# Patient Record
Sex: Female | Born: 1985 | Race: White | Hispanic: No | Marital: Married | Smoking: Never smoker
Health system: Southern US, Community
[De-identification: ages and names within clinical notes are randomized; demographics above are authoritative.]

## PROBLEM LIST (undated history)

## (undated) DIAGNOSIS — Z8711 Personal history of peptic ulcer disease: Secondary | ICD-10-CM

## (undated) DIAGNOSIS — F32A Depression, unspecified: Secondary | ICD-10-CM

## (undated) DIAGNOSIS — D649 Anemia, unspecified: Secondary | ICD-10-CM

## (undated) DIAGNOSIS — G43909 Migraine, unspecified, not intractable, without status migrainosus: Secondary | ICD-10-CM

## (undated) DIAGNOSIS — R519 Headache, unspecified: Secondary | ICD-10-CM

## (undated) DIAGNOSIS — Z8719 Personal history of other diseases of the digestive system: Secondary | ICD-10-CM

## (undated) DIAGNOSIS — F509 Eating disorder, unspecified: Secondary | ICD-10-CM

## (undated) DIAGNOSIS — F419 Anxiety disorder, unspecified: Secondary | ICD-10-CM

## (undated) DIAGNOSIS — N390 Urinary tract infection, site not specified: Secondary | ICD-10-CM

## (undated) DIAGNOSIS — R51 Headache: Secondary | ICD-10-CM

## (undated) HISTORY — DX: Personal history of other diseases of the digestive system: Z87.19

## (undated) HISTORY — DX: Anemia, unspecified: D64.9

## (undated) HISTORY — DX: Anxiety disorder, unspecified: F41.9

## (undated) HISTORY — PX: HERNIA REPAIR: SHX51

## (undated) HISTORY — DX: Migraine, unspecified, not intractable, without status migrainosus: G43.909

## (undated) HISTORY — DX: Headache: R51

## (undated) HISTORY — DX: Urinary tract infection, site not specified: N39.0

## (undated) HISTORY — DX: Depression, unspecified: F32.A

## (undated) HISTORY — DX: Eating disorder, unspecified: F50.9

## (undated) HISTORY — DX: Headache, unspecified: R51.9

## (undated) HISTORY — DX: Personal history of peptic ulcer disease: Z87.11

---

## 2012-03-01 HISTORY — PX: HERNIA REPAIR: SHX51

## 2012-12-12 ENCOUNTER — Other Ambulatory Visit (HOSPITAL_COMMUNITY)
Admission: RE | Admit: 2012-12-12 | Discharge: 2012-12-12 | Disposition: A | Discharge status: Home / Self Care | Payer: BC Managed Care – PPO | Source: Ambulatory Visit | Attending: Obstetrics & Gynecology | Admitting: Obstetrics & Gynecology

## 2012-12-12 DIAGNOSIS — N760 Acute vaginitis: Secondary | ICD-10-CM | POA: Insufficient documentation

## 2012-12-12 DIAGNOSIS — Z124 Encounter for screening for malignant neoplasm of cervix: Secondary | ICD-10-CM | POA: Insufficient documentation

## 2014-08-26 ENCOUNTER — Emergency Department
Admission: EM | Admit: 2014-08-26 | Discharge: 2014-08-26 | Disposition: A | Discharge status: Home / Self Care | Payer: BC Managed Care – PPO | Attending: Emergency Medicine | Admitting: Emergency Medicine

## 2014-08-26 DIAGNOSIS — K625 Hemorrhage of anus and rectum: Secondary | ICD-10-CM | POA: Diagnosis present

## 2014-08-26 DIAGNOSIS — Z3202 Encounter for pregnancy test, result negative: Secondary | ICD-10-CM | POA: Diagnosis not present

## 2014-08-26 LAB — URINALYSIS COMPLETE WITH MICROSCOPIC (ARMC ONLY)
BILIRUBIN URINE: NEGATIVE
Glucose, UA: NEGATIVE mg/dL
Leukocytes, UA: NEGATIVE
Nitrite: NEGATIVE
Protein, ur: NEGATIVE mg/dL
Specific Gravity, Urine: 1.016 (ref 1.005–1.030)
pH: 5 (ref 5.0–8.0)

## 2014-08-26 LAB — CBC WITH DIFFERENTIAL/PLATELET
BASOS ABS: 0.1 10*3/uL (ref 0–0.1)
Basophils Relative: 2 %
Eosinophils Absolute: 0.3 10*3/uL (ref 0–0.7)
Eosinophils Relative: 6 %
HCT: 40.8 % (ref 35.0–47.0)
Hemoglobin: 13.5 g/dL (ref 12.0–16.0)
LYMPHS PCT: 17 %
Lymphs Abs: 0.9 10*3/uL — ABNORMAL LOW (ref 1.0–3.6)
MCH: 30 pg (ref 26.0–34.0)
MCHC: 33 g/dL (ref 32.0–36.0)
MCV: 90.9 fL (ref 80.0–100.0)
Monocytes Absolute: 0.6 10*3/uL (ref 0.2–0.9)
Monocytes Relative: 11 %
NEUTROS ABS: 3.6 10*3/uL (ref 1.4–6.5)
Neutrophils Relative %: 64 %
PLATELETS: 121 10*3/uL — AB (ref 150–440)
RBC: 4.49 MIL/uL (ref 3.80–5.20)
RDW: 13.1 % (ref 11.5–14.5)
WBC: 5.6 10*3/uL (ref 3.6–11.0)

## 2014-08-26 LAB — COMPREHENSIVE METABOLIC PANEL
ALBUMIN: 4.2 g/dL (ref 3.5–5.0)
ALK PHOS: 35 U/L — AB (ref 38–126)
ALT: 26 U/L (ref 14–54)
ANION GAP: 8 (ref 5–15)
AST: 22 U/L (ref 15–41)
BUN: 8 mg/dL (ref 6–20)
CALCIUM: 8.6 mg/dL — AB (ref 8.9–10.3)
CO2: 26 mmol/L (ref 22–32)
Chloride: 106 mmol/L (ref 101–111)
Creatinine, Ser: 0.59 mg/dL (ref 0.44–1.00)
Glucose, Bld: 88 mg/dL (ref 65–99)
POTASSIUM: 4.1 mmol/L (ref 3.5–5.1)
SODIUM: 140 mmol/L (ref 135–145)
TOTAL PROTEIN: 6.8 g/dL (ref 6.5–8.1)
Total Bilirubin: 0.5 mg/dL (ref 0.3–1.2)

## 2014-08-26 NOTE — ED Provider Notes (Addendum)
[facility] Emergency Department Provider Note     Time seen: ----------------------------------------- 2:58 PM on 08/26/2014 -----------------------------------------    I have reviewed the triage vital signs and the nursing notes.   HISTORY  Chief Complaint Rectal Bleeding    HPI Virginia Robinson is a 29 y.o. female who presents ER after she had a larger bowel movement on Saturday with some bleeding. Patient states Saturday and Sunday some blood was noted, denies any today but has not had BM today. [facility] and they did a rectal exam and rectal exam was negative for blood with Hemoccult testing. Patient denies any complaint now, just was sent here by the urgent care. One of her concerns was the shaking chill she had associated with one the episodes of rectal bleeding. She is not had significant rectal bleeding before. States she had a slight bit of pain associated bowel movement in a slight bit of blood about a month ago.   No past medical history on file.  There are no active problems to display for this patient.   No past surgical history on file.  Allergies Sulfa antibiotics  Social History History  Substance Use Topics  . Smoking status: Not on file  . Smokeless tobacco: Not on file  . Alcohol Use: Not on file    Review of Systems Constitutional: Negative for fever. Positive for chills Eyes: Negative for visual changes. ENT: Negative for sore throat. Cardiovascular: Negative for chest pain. Respiratory: Negative for shortness of breath. Gastrointestinal: Negative for abdominal pain, vomiting and diarrhea. Positive for rectal bleeding Genitourinary: Negative for dysuria. Musculoskeletal: Negative for back pain. Skin: Negative for rash. Neurological: Negative for headaches, focal weakness or numbness.  10-point ROS otherwise negative.  ____________________________________________   PHYSICAL  EXAM:  VITAL SIGNS: ED Triage Vitals  Enc Vitals Group     BP 08/26/14 1419 106/65 mmHg     Pulse Rate 08/26/14 1419 67     Resp 08/26/14 1419 16     Temp 08/26/14 1419 97.8 F (36.6 C)     Temp Source 08/26/14 1419 Oral     SpO2 08/26/14 1419 100 %     Weight 08/26/14 1419 130 lb (58.968 kg)     Height 08/26/14 1419 5\' 7"  (1.702 m)     Head Cir --      Peak Flow --      Pain Score --      Pain Loc --      Pain Edu? --      Excl. in [place]? --     Constitutional: Alert and oriented. Well appearing and in no distress. Eyes: Conjunctivae are normal. PERRL. Normal extraocular movements. ENT   Head: Normocephalic and atraumatic.   Nose: No congestion/rhinnorhea.   Mouth/Throat: Mucous membranes are moist.   Neck: No stridor. Hematological/Lymphatic/Immunilogical: No cervical lymphadenopathy. Cardiovascular: Normal rate, regular rhythm. Normal and symmetric distal pulses are present in all extremities. No murmurs, rubs, or gallops. Respiratory: Normal respiratory effort without tachypnea nor retractions. Breath sounds are clear and equal bilaterally. No wheezes/rales/rhonchi. Gastrointestinal: Soft and nontender. No distention. No abdominal bruits. There is no CVA tenderness. Rectal exam was deferred as it was just performed at urgent care Musculoskeletal: Nontender with normal range of motion in all extremities. No joint effusions.  No lower extremity tenderness nor edema. Neurologic:  Normal speech and language. No gross focal neurologic deficits are appreciated. Speech is normal. No gait instability. Skin:  Skin is  warm, dry and intact. No rash noted. Psychiatric: Mood and affect are normal. Speech and behavior are normal. Patient exhibits appropriate insight and judgment. ____________________________________________  ED COURSE:  Pertinent labs & imaging results that were available during my care of the patient were reviewed by me and considered in my medical decision  making (see chart for details). We'll check basic labs, reevaluate. The only concerning problems to raise the chills that she had. ____________________________________________    LABS (pertinent positives/negatives)  Labs Reviewed  CBC WITH DIFFERENTIAL/PLATELET - Abnormal; Notable for the following:    Platelets 121 (*)    Lymphs Abs 0.9 (*)    All other components within normal limits  COMPREHENSIVE METABOLIC PANEL - Abnormal; Notable for the following:    Calcium 8.6 (*)    Alkaline Phosphatase 35 (*)    All other components within normal limits  URINALYSIS COMPLETEWITH MICROSCOPIC ([facility] ONLY) - Abnormal; Notable for the following:    Color, Urine YELLOW (*)    APPearance CLEAR (*)    Ketones, ur 1+ (*)    Hgb urine dipstick 2+ (*)    Bacteria, UA RARE (*)    Squamous Epithelial / LPF 0-5 (*)    All other components within normal limits    RADIOLOGY None  ____________________________________________  FINAL ASSESSMENT AND PLAN  Rectal bleeding  Plan: Patient looks well now, labs are performed as dictated above. No indication for intervention at this time. Stools were heme-negative, labs are grossly unremarkable, will encourage her having her platelet count rechecked. We'll refer her to GI for follow-up with the symptoms persist.   Earleen Newport, MD   Earleen Newport, MD 08/26/14 [place], MD 08/26/14 [phone]

## 2014-08-26 NOTE — ED Notes (Addendum)
Pt states she had a larger bowel movement on Saturday and had some bleeding with BM Saturday and Sunday..,denies any today but has not had a BM today..states [facility] and they did a rectal exam and had neg. hemacult

## 2014-08-26 NOTE — Discharge Instructions (Signed)
Rectal Bleeding °Rectal bleeding is when blood passes out of the anus. It is usually a sign that something is wrong. It may not be serious, but it should always be evaluated. Rectal bleeding may present as bright red blood or extremely dark stools. The color may range from dark red or maroon to black (like tar). It is important that the cause of rectal bleeding be identified so treatment can be started and the problem corrected. °CAUSES  °· Hemorrhoids. These are enlarged (dilated) blood vessels or veins in the anal or rectal area. °· Fistulas. These are abnormal, burrowing channels that usually run from inside the rectum to the skin around the anus. They can bleed. °· Anal fissures. This is a tear in the tissue of the anus. Bleeding occurs with bowel movements. °· Diverticulosis. This is a condition in which pockets or sacs project from the bowel wall. Occasionally, the sacs can bleed. °· Diverticulitis. This is an infection involving diverticulosis of the colon. °· Proctitis and colitis. These are conditions in which the rectum, colon, or both, can become inflamed and pitted (ulcerated). °· Polyps and cancer. Polyps are non-cancerous (benign) growths in the colon that may bleed. Certain types of polyps turn into cancer. °· Protrusion of the rectum. Part of the rectum can project from the anus and bleed. °· Certain medicines. °· Intestinal infections. °· Blood vessel abnormalities. °HOME CARE INSTRUCTIONS °· Eat a high-fiber diet to keep your stool soft. °· Limit activity. °· Drink enough fluids to keep your urine clear or pale yellow. °· Warm baths may be useful to soothe rectal pain. °· Follow up with your caregiver as directed. °SEEK IMMEDIATE MEDICAL CARE IF: °· You develop increased bleeding. °· You have black or dark red stools. °· You vomit blood or material that looks like coffee grounds. °· You have abdominal pain or tenderness. °· You have a fever. °· You feel weak, nauseous, or you faint. °· You have  severe rectal pain or you are unable to have a bowel movement. °MAKE SURE YOU: °· Understand these instructions. °· Will watch your condition. °· Will get help right away if you are not doing well or get worse. °Document Released: 08/07/2001 Document Revised: 05/10/2011 Document Reviewed: 08/02/2010 °ExitCare® Patient Information ©2015 [employer]. This information is not intended to replace advice given to you by your health care provider. Make sure you discuss any questions you have with your health care provider. ° °

## 2014-08-27 LAB — POCT PREGNANCY, URINE: PREG TEST UR: NEGATIVE

## 2015-05-05 ENCOUNTER — Ambulatory Visit (INDEPENDENT_AMBULATORY_CARE_PROVIDER_SITE_OTHER): Payer: BC Managed Care – PPO | Admitting: Physician Assistant

## 2015-05-05 VITALS — BP 102/58 | HR 59 | Temp 98.3°F | Resp 17 | Ht 66.0 in | Wt 128.0 lb

## 2015-05-05 DIAGNOSIS — L739 Follicular disorder, unspecified: Secondary | ICD-10-CM

## 2015-05-05 MED ORDER — DOXYCYCLINE HYCLATE 100 MG PO CAPS: 100.0000 mg | ORAL_CAPSULE | Freq: Two times a day (BID) | ORAL | Status: AC

## 2015-05-05 NOTE — Progress Notes (Signed)
[facility] [address], [place] [address] [phone]  Date:  05/05/2015   Name:  Virginia Robinson   DOB:  Mar 13, 1985   MRN:  [id number]  PCP:  No primary care provider on file.    Chief Complaint: armpit pain   History of Present Illness:  This is a 30 y.o. female who is presenting with bilateral axilla pain x 4 days. States she has noticed some bumps under her skin. For the past 3 days she has been feeling more achy in her axillas and sore to the touch. She noticed some razor burn which she states is abnormal for her. She endorses cutting both axillas with shaving last week. She uses disposable razors. Last changed razors 2 weeks ago. She denies fever, chills. This has never happened before. Has not yet tried anything for her symptoms She is going on vacation to [place] in 5 days.  Review of Systems:  Review of Systems See HPI  There are no active problems to display for this patient.   Prior to Admission medications   Not on File    Allergies  Allergen Reactions  . Sulfa Antibiotics Rash    Past Surgical History  Procedure Laterality Date  . Hernia repair      Social History  Substance Use Topics  . Smoking status: Never Smoker   . Smokeless tobacco: None  . Alcohol Use: No    Family History  Problem Relation Age of Onset  . Cancer Mother   . Hyperlipidemia Father   . Hyperlipidemia Maternal Grandmother   . Diabetes Maternal Grandfather   . Heart disease Maternal Grandfather   . Cancer Paternal Grandmother   . Cancer Paternal Grandfather   . Heart disease Paternal Grandfather     Medication list has been reviewed and updated.  Physical Examination:  Physical Exam  Constitutional: She is oriented to person, place, and time. She appears well-developed and well-nourished. No distress.  HENT:  Head: Normocephalic and atraumatic.  Right Ear: Hearing normal.  Left Ear: Hearing normal.  Nose: Nose normal.  Eyes: Conjunctivae and  lids are normal. Right eye exhibits no discharge. Left eye exhibits no discharge. No scleral icterus.  Pulmonary/Chest: Effort normal. No respiratory distress.  Musculoskeletal: Normal range of motion.  Neurological: She is alert and oriented to person, place, and time.  Skin: Skin is warm, dry and intact.  Bilateral axilla with erythema and papules assoc with hair follicle. Mild ttp. Right axilla with 2 superificial <1 cm nodules. No palpable lymph nodes. No other rashes.  Psychiatric: She has a normal mood and affect. Her speech is normal and behavior is normal. Thought content normal.   BP 102/58 mmHg  Pulse 59  Temp(Src) 98.3 F (36.8 C) (Oral)  Resp 17  Ht 5\' 6"  (1.676 m)  Wt 128 lb (58.06 kg)  BMI 20.67 kg/m2  SpO2 98%  Assessment and Plan:  1. Folliculitis Exam and history consistent with bilateral axilla folliculitis. Treat with doxy bid x 7 days. Apply warm compresses. Avoid shaving for next 1 week, change razors when do shave. Return if not getting better in 1 week or at any time if symptoms worsen. Counseled on pregnancy and sun precautions with doxy. - doxycycline (VIBRAMYCIN) 100 MG capsule; Take 1 capsule (100 mg total) by mouth 2 (two) times daily. AVOID EXCESS SUN EXPOSURE WHILE ON THIS MEDICATION  Dispense: 14 capsule; Refill: 0   Benjaman Pott. Drenda Freeze, MHS [facility]  05/05/2015

## 2015-05-05 NOTE — Patient Instructions (Signed)
Take doxy twice a day for 7 days. Avoid excess sun exposure while on this medication. Wear protection to avoid pregnancy. Apply warm compresses. Avoid shaving for next 5 days. Change razors. If not getting better in 5 days, return

## 2016-12-06 ENCOUNTER — Encounter: Payer: Self-pay | Admitting: Family Medicine

## 2016-12-06 ENCOUNTER — Ambulatory Visit (INDEPENDENT_AMBULATORY_CARE_PROVIDER_SITE_OTHER): Payer: BC Managed Care – PPO | Admitting: Family Medicine

## 2016-12-06 VITALS — BP 98/60 | HR 62 | Temp 98.4°F | Ht 66.0 in | Wt 140.9 lb

## 2016-12-06 DIAGNOSIS — Z8669 Personal history of other diseases of the nervous system and sense organs: Secondary | ICD-10-CM | POA: Insufficient documentation

## 2016-12-06 DIAGNOSIS — Z975 Presence of (intrauterine) contraceptive device: Secondary | ICD-10-CM | POA: Diagnosis not present

## 2016-12-06 DIAGNOSIS — Z862 Personal history of diseases of the blood and blood-forming organs and certain disorders involving the immune mechanism: Secondary | ICD-10-CM

## 2016-12-06 DIAGNOSIS — F411 Generalized anxiety disorder: Secondary | ICD-10-CM | POA: Insufficient documentation

## 2016-12-06 DIAGNOSIS — F32A Depression, unspecified: Secondary | ICD-10-CM | POA: Insufficient documentation

## 2016-12-06 DIAGNOSIS — F320 Major depressive disorder, single episode, mild: Secondary | ICD-10-CM | POA: Diagnosis not present

## 2016-12-06 DIAGNOSIS — R5383 Other fatigue: Secondary | ICD-10-CM | POA: Diagnosis not present

## 2016-12-06 LAB — CBC
HEMATOCRIT: 41.2 % (ref 36.0–46.0)
Hemoglobin: 14 g/dL (ref 12.0–15.0)
MCHC: 33.9 g/dL (ref 30.0–36.0)
MCV: 91 fl (ref 78.0–100.0)
PLATELETS: 185 10*3/uL (ref 150.0–400.0)
RBC: 4.53 Mil/uL (ref 3.87–5.11)
RDW: 12.7 % (ref 11.5–15.5)
WBC: 6.1 10*3/uL (ref 4.0–10.5)

## 2016-12-06 LAB — COMPREHENSIVE METABOLIC PANEL
ALT: 10 U/L (ref 0–35)
AST: 10 U/L (ref 0–37)
Albumin: 4.5 g/dL (ref 3.5–5.2)
Alkaline Phosphatase: 42 U/L (ref 39–117)
BUN: 6 mg/dL (ref 6–23)
CHLORIDE: 104 meq/L (ref 96–112)
CO2: 29 mEq/L (ref 19–32)
CREATININE: 0.62 mg/dL (ref 0.40–1.20)
Calcium: 9.2 mg/dL (ref 8.4–10.5)
GFR: 117.95 mL/min (ref >60.00)
Glucose, Bld: 86 mg/dL (ref 70–99)
Potassium: 4 mEq/L (ref 3.5–5.1)
Sodium: 141 mEq/L (ref 135–145)
Total Bilirubin: 0.5 mg/dL (ref 0.2–1.2)
Total Protein: 6.7 g/dL (ref 6.0–8.3)

## 2016-12-06 LAB — TSH: TSH: 1.73 u[IU]/mL (ref 0.35–4.50)

## 2016-12-06 LAB — HDL CHOLESTEROL: HDL: 47.2 mg/dL (ref >39.00)

## 2016-12-06 LAB — VITAMIN D 25 HYDROXY (VIT D DEFICIENCY, FRACTURES): VITD: 29.81 ng/mL — ABNORMAL LOW (ref 30.00–100.00)

## 2016-12-06 LAB — HEMOGLOBIN A1C: Hgb A1c MFr Bld: 5 % (ref 4.6–6.5)

## 2016-12-06 LAB — CHOLESTEROL, TOTAL: CHOLESTEROL: 134 mg/dL (ref 0–200)

## 2016-12-06 LAB — VITAMIN B12: Vitamin B-12: 1313 pg/mL — ABNORMAL HIGH (ref 211–911)

## 2016-12-06 NOTE — Progress Notes (Signed)
HPI:  Nathalia Wismer is here to establish care.  Last PCP and physical:  Has the following chronic problems that require follow up and concerns today:  Hx Anemia: -chronic since highschool at least and report eval in the past -takes daily b12 and MV with iron -wants to check-has mirena, sees gyn  GAD:  -chronic -? Mild depression -high stress, worries a lot always -hx caloric restriction remotely - > 10 years ago -symptoms include: generalized worry, tension, irritability, poor concentration, fatigue, anhedonia -has done yoga, , meditation and CBT in the past, none now -feels more tired then usual this year - lots of stress looking for job then new job -no fevers, malaise, wt loss, other symptoms -denies severe symptoms, prior tx, SI  Hx migraines: -rare and triggered by stress -takes OTC meds (excedrin for this)  ROS negative for unless reported above: fevers, unintentional weight loss, hearing or vision loss, chest pain, palpitations, struggling to breath, hemoptysis, melena, hematochezia, hematuria, falls, loc, si, thoughts of self harm  Past Medical History:  Diagnosis Date  . Anemia    reports since highschool with remote eval - takes B12 and MV with iron  . Eating disorder    caloric restriction remotely - in college  . Frequent headaches   . History of stomach ulcers    no EGD, ? GERD rather then ulcer  . Migraines   . UTI (urinary tract infection)     Past Surgical History:  Procedure Laterality Date  . HERNIA REPAIR      Family History  Problem Relation Age of Onset  . Cancer Mother   . Hyperlipidemia Father   . COPD Father   . Hyperlipidemia Maternal Grandmother   . Diabetes Maternal Grandfather   . Cancer Paternal Grandmother   . Cancer Paternal Grandfather   . Heart disease Paternal Grandfather     Social History   Social History  . Marital status: Married    Spouse name: N/A  . Number of children: N/A  . Years of education: N/A    Social History Main Topics  . Smoking status: Never Smoker  . Smokeless tobacco: Never Used  . Alcohol use No  . Drug use: No  . Sexual activity: No   Other Topics Concern  . 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  Cyanocobalamin (VITAMIN B12 PO), Take by mouth., Disp: , Rfl:  .  levonorgestrel (MIRENA) 20 MCG/24HR IUD, 1 each by Intrauterine route once., Disp: , Rfl:  .  Multiple Vitamins-Minerals (MULTIVITAMIN ADULT PO), Take by mouth., Disp: , Rfl:  .  OVER THE COUNTER MEDICATION, Vitamin C with Zinc, Disp: , Rfl:   EXAM:  Vitals:   12/06/16 1321  BP: 98/60  Pulse: 62  Temp: 98.4 F (36.9 C)    Body mass index is 22.74 kg/m.  GENERAL: vitals reviewed and listed above, alert, oriented, appears well hydrated and in no acute distress  HEENT: atraumatic, conjunttiva clear, no obvious abnormalities on inspection of external nose and ears  NECK: no obvious masses on inspection  LUNGS: clear to auscultation bilaterally, no wheezes, rales or rhonchi, good air movement  CV: HRRR, no  peripheral edema  MS: moves all extremities without noticeable abnormality  PSYCH: pleasant and cooperative, no obvious depression or anxiety  ASSESSMENT AND PLAN:  Discussed the following assessment and plan:  History of anemia - Plan: CBC  GAD (generalized anxiety disorder)  Mild depression (HCC)  Other fatigue - Plan: Comprehensive metabolic panel, Hemoglobin A1c, TSH, Vitamin B12, VITAMIN D 25 Hydroxy (Vit-D Deficiency, Fractures), Cholesterol, total, HDL cholesterol  Hx of migraines  IUD (intrauterine device) in place -We reviewed the PMH, PSH, FH, SH, Meds and Allergies. -discussed treatment options for GAD and mild  depression - most likely cause of her fatigue -she opted to start with CBT and info provided for [facility] -labs per orders to eval for other causes fatigue  -lifestyle recs -follow up 3 months  -Patient advised to return or notify a doctor immediately if symptoms worsen or persist or new concerns arise.  Patient Instructions  BEFORE YOU LEAVE: -phq9 -follow up: 3 months -labs  Check on the tetanus booster  We have ordered labs or studies at this visit. It can take up to 1-2 weeks for results and processing. IF results require follow up or explanation, we will call you with instructions. Clinically stable results will be released to your [facility]. If you have not heard from [place] or cannot find your results in [facility] in 2 weeks please contact our office at [phone].  If you are not yet signed up for [facility], please consider signing up.  Please call to set up Cognitive Behavioral Therapy.  Start to gradually increase regular exercise - goal of at least 150 minutes of aerobic exercise per week.  WE NOW OFFER   [facility] FAST TRACK!!!  SAME DAY Appointments for ACUTE CARE  Such as: Sprains, Injuries, cuts, abrasions, rashes, muscle pain, joint pain, back pain Colds, flu, sore throats, headache, allergies, cough, fever  Ear pain, sinus and eye infections Abdominal pain, nausea, vomiting, diarrhea, upset stomach Animal/insect bites  3 Easy Ways to Schedule: Walk-In Scheduling Call in scheduling Mychart Sign-up: [email or web address]                  Colin Benton R.

## 2016-12-06 NOTE — Patient Instructions (Addendum)
BEFORE YOU LEAVE: -phq9 -follow up: 3 months -labs  Check on the tetanus booster  We have ordered labs or studies at this visit. It can take up to 1-2 weeks for results and processing. IF results require follow up or explanation, we will call you with instructions. Clinically stable results will be released to your [facility]. If you have not heard from [place] or cannot find your results in [facility] in 2 weeks please contact our office at [phone].  If you are not yet signed up for [facility], please consider signing up.  Please call to set up Cognitive Behavioral Therapy.  Start to gradually increase regular exercise - goal of at least 150 minutes of aerobic exercise per week.  WE NOW OFFER   [facility] FAST TRACK!!!  SAME DAY Appointments for ACUTE CARE  Such as: Sprains, Injuries, cuts, abrasions, rashes, muscle pain, joint pain, back pain Colds, flu, sore throats, headache, allergies, cough, fever  Ear pain, sinus and eye infections Abdominal pain, nausea, vomiting, diarrhea, upset stomach Animal/insect bites  3 Easy Ways to Schedule: Walk-In Scheduling Call in scheduling Mychart Sign-up: [email or web address]

## 2017-01-25 ENCOUNTER — Ambulatory Visit: Payer: BC Managed Care – PPO | Admitting: Family Medicine

## 2017-01-25 ENCOUNTER — Encounter: Payer: Self-pay | Admitting: Family Medicine

## 2017-01-25 VITALS — BP 82/60 | HR 79 | Temp 98.3°F | Ht 66.0 in | Wt 140.4 lb

## 2017-01-25 DIAGNOSIS — M542 Cervicalgia: Secondary | ICD-10-CM

## 2017-01-25 DIAGNOSIS — M546 Pain in thoracic spine: Secondary | ICD-10-CM | POA: Diagnosis not present

## 2017-01-25 DIAGNOSIS — R293 Abnormal posture: Secondary | ICD-10-CM | POA: Diagnosis not present

## 2017-01-25 DIAGNOSIS — F439 Reaction to severe stress, unspecified: Secondary | ICD-10-CM | POA: Diagnosis not present

## 2017-01-25 DIAGNOSIS — M25511 Pain in right shoulder: Secondary | ICD-10-CM

## 2017-01-25 DIAGNOSIS — M25512 Pain in left shoulder: Secondary | ICD-10-CM | POA: Diagnosis not present

## 2017-01-25 NOTE — Patient Instructions (Signed)
BEFORE YOU LEAVE: -follow up: 3-4 weeks  Do the postural neck strengthening exercises daily: -stand with heals, buttocks, shoulder blades and head against wall -press back against wall gently with your head for 5 seconds, then against counter pressure with finger tips: to the right for 5 seconds, forward for 5 seconds, then to the left for 5 second (keep head straight and against wall the entire time) -repeat 10 times  Aleve as needed for pain per instructions.  Schedule the cognitive behavioral therapy at for stress as planned.  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  Get regular gentle aerobic exercise such as walking.  I hope you are feeling better soon! Seek care sooner if your symptoms worsen, new concerns arise or you are not improving with treatment.

## 2017-01-25 NOTE — Progress Notes (Signed)
HPI:  Virginia Robinson is a very pleasant 31 year old with a past medical history significant for anxiety and depression here for an acute visit regarding neck, shoulders and upper back pain.  She had a flare of this type of pain about a year ago.  This flares started a couple weeks ago.  She has tension and sometimes muscle pain in the neck, trapezius muscle region and thoracic region between the shoulder blades at times.  She has been trying yoga, weightlifting and stretching for this.  She does admit she has been doing a lot of desk work, has poor posture and has had a lot of stress.  She has not had a chance to do the cognitive behavioral therapy yet, but she plans to do this soon.  She prefers not to take medications and would like to avoid imaging unless necessary. No weakness, numbness, radiation to extremities, fevers, unexplained weight loss or increasing fatigue.  She has been taking the vitamin D.  No severe symptoms of depression or suicidal ideation.  ROS: See pertinent positives and negatives per HPI.  Past Medical History:  Diagnosis Date  . Anemia    reports since highschool with remote eval - takes B12 and MV with iron  . Eating disorder    caloric restriction remotely - in college  . Frequent headaches   . History of stomach ulcers    no EGD, ? GERD rather then ulcer  . Migraines   . UTI (urinary tract infection)     Past Surgical History:  Procedure Laterality Date  . HERNIA REPAIR      Family History  Problem Relation Age of Onset  . Cancer Mother   . Hyperlipidemia Father   . COPD Father   . Hyperlipidemia Maternal Grandmother   . Diabetes Maternal Grandfather   . Cancer Paternal Grandmother   . Cancer Paternal Grandfather   . Heart disease Paternal Grandfather     Social History   Socioeconomic History  . Marital status: Married    Spouse name: None  . Number of children: None  . Years of education: None  . Highest education level: None  Social  Needs  . Financial resource strain: None  . Food insecurity - worry: None  . Food insecurity - inability: None  . Transportation needs - medical: None  . Transportation needs - non-medical: None  Occupational History  . None  Tobacco Use  . Smoking status: Never Smoker  . Smokeless tobacco: Never Used  Substance and Sexual Activity  . Alcohol use: No    Alcohol/week: 0.0 oz  . Drug use: No  . Sexual activity: No    Birth control/protection: Abstinence  Other Topics Concern  . 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  Cholecalciferol (VITAMIN D PO), Take 800 Units by mouth daily., Disp: , Rfl:  .  Cyanocobalamin (VITAMIN B12 PO), Take by mouth., Disp: , Rfl:  .  levonorgestrel (MIRENA) 20 MCG/24HR IUD, 1 each by Intrauterine route once., Disp: , Rfl:  .  Multiple Vitamins-Minerals (MULTIVITAMIN ADULT PO), Take by mouth., Disp: , Rfl:  .  OVER THE COUNTER MEDICATION, Vitamin C with Zinc, Disp: , Rfl:  EXAM:  Vitals:   01/25/17 0843  BP: (!) 82/60  Pulse: 79  Temp: 98.3 F (36.8 C)    Body mass index is 22.66 kg/m.  GENERAL: vitals reviewed and listed above, alert, oriented, appears well hydrated and in no acute distress  HEENT: atraumatic, conjunttiva clear, no obvious abnormalities on inspection of external nose and ears  NECK: no obvious masses on inspection  LUNGS: clear to auscultation bilaterally, no wheezes, rales or rhonchi, good air movement  CV: HRRR, no peripheral edema  MS: moves all extremities without noticeable abnormality, normal ROM head/neck/shoulders, head forward posture with L shoulder sl higher the R, TTP sub occ muscles, traps bilat and thoracic paraspinal muscles mild, neg spurling, normal  strength/sensitivity to light touch bilat upper ext, no meningeal signs, no bony TTP.  PSYCH: pleasant and cooperative, no obvious depression or anxiety  ASSESSMENT AND PLAN:  Discussed the following assessment and plan:  Neck pain  Bilateral shoulder pain, unspecified chronicity  Bilateral thoracic back pain, unspecified chronicity  Stress  Poor posture  -we discussed possible serious and likely etiologies, workup and treatment, treatment risks and return precautions -after this discussion, Virginia Robinson opted for home exercises for posture, Aleve as needed for pain, cognitive behavioral therapy for stress -Refers not to do any prescription medications or imaging at this time -/oral medication for stress -follow up advised 3-4 weeks -of course, we advised Virginia Robinson  to return or notify a doctor immediately if symptoms worsen or persist or new concerns arise.  .  -Patient advised to return or notify a doctor immediately if symptoms worsen or persist or new concerns arise.  Patient Instructions  BEFORE YOU LEAVE: -follow up: 3-4 weeks  Do the postural neck strengthening exercises daily: -stand with heals, buttocks, shoulder blades and head against wall -press back against wall gently with your head for 5 seconds, then against counter pressure with finger tips: to the right for 5 seconds, forward for 5 seconds, then to the left for 5 second (keep head straight and against wall the entire time) -repeat 10 times  Aleve as needed for pain per instructions.  Schedule the cognitive behavioral therapy at for stress as planned.  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  Get regular gentle aerobic exercise such as walking.  I hope you are feeling better soon! Seek care sooner if your symptoms worsen, new concerns arise or you are not improving with treatment.      Colin Benton R., DO

## 2017-03-10 ENCOUNTER — Encounter: Payer: Self-pay | Admitting: Family Medicine

## 2017-07-12 ENCOUNTER — Ambulatory Visit: Payer: BC Managed Care – PPO | Admitting: Family Medicine

## 2017-07-12 ENCOUNTER — Encounter: Payer: Self-pay | Admitting: Family Medicine

## 2017-07-12 VITALS — BP 90/70 | HR 74 | Temp 98.1°F | Ht 66.0 in | Wt 144.5 lb

## 2017-07-12 DIAGNOSIS — Z7189 Other specified counseling: Secondary | ICD-10-CM

## 2017-07-12 DIAGNOSIS — Z23 Encounter for immunization: Secondary | ICD-10-CM | POA: Diagnosis not present

## 2017-07-12 DIAGNOSIS — Z7184 Encounter for health counseling related to travel: Secondary | ICD-10-CM

## 2017-07-12 DIAGNOSIS — Z7185 Encounter for immunization safety counseling: Secondary | ICD-10-CM

## 2017-07-12 NOTE — Progress Notes (Signed)
HPI:  Using dictation device. Unfortunately this device frequently misinterprets words/phrases.  Acute visit for vaccine counseling for travel to [facility] for work this week.  Reports she feels like she is up-to-date on all of her childhood immunizations as needed all them for college and for work in an academic setting.  She did not bring her vaccine record with her, but she is pretty sure she has had 2 doses of her MMR and all of her routine vaccines.  She is up-to-date on her Tdap.  She will not be working around animals, and denies plans to get tattoos, piercings or engage in sexual or needle related activities.  Hep A for water Hep B b/B fluids Plus routine vaccines.  ROS: See pertinent positives and negatives per HPI.  Past Medical History:  Diagnosis Date  . Anemia    reports since highschool with remote eval - takes B12 and MV with iron  . Eating disorder    caloric restriction remotely - in college  . Frequent headaches   . History of stomach ulcers    no EGD, ? GERD rather then ulcer  . Migraines   . UTI (urinary tract infection)     Past Surgical History:  Procedure Laterality Date  . HERNIA REPAIR      Family History  Problem Relation Age of Onset  . Cancer Mother   . Hyperlipidemia Father   . COPD Father   . Hyperlipidemia Maternal Grandmother   . Diabetes Maternal Grandfather   . Cancer Paternal Grandmother   . Cancer Paternal Grandfather   . Heart disease Paternal Grandfather     SOCIAL HX: See HPI   Current Outpatient Medications:  .  Cholecalciferol (VITAMIN D PO), Take 800 Units by mouth daily., Disp: , Rfl:  .  Cyanocobalamin (VITAMIN B12 PO), Take by mouth., Disp: , Rfl:  .  levonorgestrel (MIRENA) 20 MCG/24HR IUD, 1 each by Intrauterine route once., Disp: , Rfl:  .  Multiple Vitamins-Minerals (MULTIVITAMIN ADULT PO), Take by mouth., Disp: , Rfl:  .  OVER THE COUNTER MEDICATION, Vitamin C with Zinc, Disp: , Rfl:   EXAM:  Vitals:    07/12/17 1609  BP: 90/70  Pulse: 74  Temp: 98.1 F (36.7 C)    Body mass index is 23.32 kg/m.  GENERAL: vitals reviewed and listed above, alert, oriented, appears well hydrated and in no acute distress  HEENT: atraumatic, conjunttiva clear, no obvious abnormalities on inspection of external nose and ears  NECK: no obvious masses on inspection  LUNGS: clear to auscultation bilaterally, no wheezes, rales or rhonchi, good air movement  CV: HRRR, no peripheral edema  MS: moves all extremities without noticeable abnormality  PSYCH: pleasant and cooperative, no obvious depression or anxiety  ASSESSMENT AND PLAN:  Discussed the following assessment and plan: More than 50% of over 25 minutes spent in total in caring for this patient was spent face-to-face with the patient, counseling and/or coordinating care.    Travel advice encounter  Vaccine counseling  Need for hepatitis A vaccination - Plan: Hepatitis A vaccine pediatric / adolescent 2 dose IM  -Discussed the CDC guidelines for her travel itinerary -Advised that she ensure that she has had all of her vaccines and get an MMR dose otherwise, she declined the MMR dose and prefers to check her history, it is likely that she has received 2 doses of the live MMR vaccine if she has had all her vaccines as she reports -She got her hepatitis A vaccine  today as she will be there for several weeks  Patient Instructions  BEFORE YOU LEAVE: -Hep A vaccine -follow up: yearly for physical  Check on your vaccine records today and let us know if not complete.  Hepatitis A Vaccine: What You Need to Know 1. Why get vaccinated? Hepatitis A is a serious liver disease. It is caused by the hepatitis A virus (HAV). HAV is spread from person to person through contact with the feces (stool) of people who are infected, which can easily happen if someone does not wash his or her hands properly. You can also get hepatitis A from food, water, or  objects contaminated with HAV. Symptoms of hepatitis A can include:  fever, fatigue, loss of appetite, nausea, vomiting, and/or joint pain  severe stomach pains and diarrhea (mainly in children), or  jaundice (yellow skin or eyes, dark urine, clay-colored bowel movements).  These symptoms usually appear 2 to 6 weeks after exposure and usually last less than 2 months, although some people can be ill for as long as 6 months. If you have hepatitis A you may be too ill to work. Children often do not have symptoms, but most adults do. You can spread HAV without having symptoms. Hepatitis A can cause liver failure and death, although this is rare and occurs more commonly in persons 99 years of age or older and persons with other liver diseases, such as hepatitis B or C. Hepatitis A vaccine can prevent hepatitis A. Hepatitis A vaccines were recommended in the [place] beginning in 1996. Since then, the number of cases reported each year in the [place] has dropped from around 31,000 cases to fewer than 1,500 cases. 2. Hepatitis A vaccine Hepatitis A vaccine is an inactivated (killed) vaccine. You will need 2 doses for long-lasting protection. These doses should be given at least 6 months apart. Children are routinely vaccinated between their first and second birthdays (51 through 40 months of age). Older children and adolescents can get the vaccine after 23 months. Adults who have not been vaccinated previously and want to be protected against hepatitis A can also get the vaccine. You should get hepatitis A vaccine if you:  are traveling to countries where hepatitis A is common,  are a man who has sex with other men,  use illegal drugs,  have a chronic liver disease such as hepatitis B or hepatitis C,  are being treated with clotting-factor concentrates,  work with hepatitis A-infected animals or in a hepatitis A research laboratory, or  expect to have close personal contact with an  international adoptee from a country where hepatitis A is common  Ask your healthcare provider if you want more information about any of these groups. There are no known risks to getting hepatitis A vaccine at the same time as other vaccines. 3. Some people should not get this vaccine Tell the person who is giving you the vaccine:  If you have any severe, life-threatening allergies. If you ever had a life-threatening allergic reaction after a dose of hepatitis A vaccine, or have a severe allergy to any part of this vaccine, you may be advised not to get vaccinated. Ask your health care provider if you want information about vaccine components.  If you are not feeling well. If you have a mild illness, such as a cold, you can probably get the vaccine today. If you are moderately or severely ill, you should probably wait until you recover. Your doctor can advise you.  4.  Risks of a vaccine reaction With any medicine, including vaccines, there is a chance of side effects. These are usually mild and go away on their own, but serious reactions are also possible. Most people who get hepatitis A vaccine do not have any problems with it. Minor problems following hepatitis A vaccine include:  soreness or redness where the shot was given  low-grade fever  headache  tiredness  If these problems occur, they usually begin soon after the shot and last 1 or 2 days. Your doctor can tell you more about these reactions. Other problems that could happen after this vaccine:  People sometimes faint after a medical procedure, including vaccination. Sitting or lying down for about 15 minutes can help prevent fainting, and injuries caused by a fall. Tell your provider if you feel dizzy, or have vision changes or ringing in the ears.  Some people get shoulder pain that can be more severe and longer lasting than the more routine soreness that can follow injections. This happens very rarely.  Any medication can  cause a severe allergic reaction. Such reactions from a vaccine are very rare, estimated at about 1 in a million doses, and would happen within a few minutes to a few hours after the vaccination. As with any medicine, there is a very remote chance of a vaccine causing a serious injury or death. The safety of vaccines is always being monitored. For more information, visit: http://www.aguilar.org/ 5. What if there is a serious problem? What should I look for? Look for anything that concerns you, such as signs of a severe allergic reaction, very high fever, or unusual behavior. Signs of a severe allergic reaction can include hives, swelling of the face and throat, difficulty breathing, a fast heartbeat, dizziness, and weakness. These would start a few minutes to a few hours after the vaccination. What should I do?  If you think it is a severe allergic reaction or other emergency that can't wait, call 9-1-1 or get to the nearest hospital. Otherwise, call your clinic.  Afterward, the reaction should be reported to the Vaccine Adverse Event Reporting System (VAERS). Your doctor should file this report, or you can do it yourself through the VAERS web site at www.vaers.SamedayNews.es, or by calling 959-658-1958. ? VAERS does not give medical advice. 6. The National Vaccine Injury Compensation Program The [employer] Vaccine Injury Compensation Program (VICP) is a federal program that was created to compensate people who may have been injured by certain vaccines. Persons who believe they may have been injured by a vaccine can learn about the program and about filing a claim by calling 628-128-2114 or visiting the [place] website at GoldCloset.com.ee. There is a time limit to file a claim for compensation. 7. How can I learn more?  Ask your healthcare provider. He or she can give you the vaccine package insert or suggest other sources of information.  Call your local or state health  department.  Contact the Centers for Disease Control and Prevention (CDC): ? Call 765-498-7648 (1-800-CDC-INFO) or ? Visit CDC's website at http://hunter.com/ CDC Hepatitis A Vaccine VIS (09/18/2014) This information is not intended to replace advice given to you by your health care provider. Make sure you discuss any questions you have with your health care provider. Document Released: 12/10/2005 Document Revised: 11/06/2015 Document Reviewed: 11/06/2015 Elsevier Interactive Patient Education  2017 [place], [place]

## 2017-07-12 NOTE — Patient Instructions (Addendum)
BEFORE YOU LEAVE: -Hep A vaccine -follow up: yearly for physical  Check on your vaccine records today and let us know if not complete.  Hepatitis A Vaccine: What You Need to Know 1. Why get vaccinated? Hepatitis A is a serious liver disease. It is caused by the hepatitis A virus (HAV). HAV is spread from person to person through contact with the feces (stool) of people who are infected, which can easily happen if someone does not wash his or her hands properly. You can also get hepatitis A from food, water, or objects contaminated with HAV. Symptoms of hepatitis A can include:  fever, fatigue, loss of appetite, nausea, vomiting, and/or joint pain  severe stomach pains and diarrhea (mainly in children), or  jaundice (yellow skin or eyes, dark urine, clay-colored bowel movements).  These symptoms usually appear 2 to 6 weeks after exposure and usually last less than 2 months, although some people can be ill for as long as 6 months. If you have hepatitis A you may be too ill to work. Children often do not have symptoms, but most adults do. You can spread HAV without having symptoms. Hepatitis A can cause liver failure and death, although this is rare and occurs more commonly in persons 56 years of age or older and persons with other liver diseases, such as hepatitis B or C. Hepatitis A vaccine can prevent hepatitis A. Hepatitis A vaccines were recommended in the [place] beginning in 1996. Since then, the number of cases reported each year in the [place] has dropped from around 31,000 cases to fewer than 1,500 cases. 2. Hepatitis A vaccine Hepatitis A vaccine is an inactivated (killed) vaccine. You will need 2 doses for long-lasting protection. These doses should be given at least 6 months apart. Children are routinely vaccinated between their first and second birthdays (86 through 34 months of age). Older children and adolescents can get the vaccine after 23 months. Adults who have not been  vaccinated previously and want to be protected against hepatitis A can also get the vaccine. You should get hepatitis A vaccine if you:  are traveling to countries where hepatitis A is common,  are a man who has sex with other men,  use illegal drugs,  have a chronic liver disease such as hepatitis B or hepatitis C,  are being treated with clotting-factor concentrates,  work with hepatitis A-infected animals or in a hepatitis A research laboratory, or  expect to have close personal contact with an international adoptee from a country where hepatitis A is common  Ask your healthcare provider if you want more information about any of these groups. There are no known risks to getting hepatitis A vaccine at the same time as other vaccines. 3. Some people should not get this vaccine Tell the person who is giving you the vaccine:  If you have any severe, life-threatening allergies. If you ever had a life-threatening allergic reaction after a dose of hepatitis A vaccine, or have a severe allergy to any part of this vaccine, you may be advised not to get vaccinated. Ask your health care provider if you want information about vaccine components.  If you are not feeling well. If you have a mild illness, such as a cold, you can probably get the vaccine today. If you are moderately or severely ill, you should probably wait until you recover. Your doctor can advise you.  4. Risks of a vaccine reaction With any medicine, including vaccines, there is a chance  of side effects. These are usually mild and go away on their own, but serious reactions are also possible. Most people who get hepatitis A vaccine do not have any problems with it. Minor problems following hepatitis A vaccine include:  soreness or redness where the shot was given  low-grade fever  headache  tiredness  If these problems occur, they usually begin soon after the shot and last 1 or 2 days. Your doctor can tell you more  about these reactions. Other problems that could happen after this vaccine:  People sometimes faint after a medical procedure, including vaccination. Sitting or lying down for about 15 minutes can help prevent fainting, and injuries caused by a fall. Tell your provider if you feel dizzy, or have vision changes or ringing in the ears.  Some people get shoulder pain that can be more severe and longer lasting than the more routine soreness that can follow injections. This happens very rarely.  Any medication can cause a severe allergic reaction. Such reactions from a vaccine are very rare, estimated at about 1 in a million doses, and would happen within a few minutes to a few hours after the vaccination. As with any medicine, there is a very remote chance of a vaccine causing a serious injury or death. The safety of vaccines is always being monitored. For more information, visit: http://www.aguilar.org/ 5. What if there is a serious problem? What should I look for? Look for anything that concerns you, such as signs of a severe allergic reaction, very high fever, or unusual behavior. Signs of a severe allergic reaction can include hives, swelling of the face and throat, difficulty breathing, a fast heartbeat, dizziness, and weakness. These would start a few minutes to a few hours after the vaccination. What should I do?  If you think it is a severe allergic reaction or other emergency that can't wait, call 9-1-1 or get to the nearest hospital. Otherwise, call your clinic.  Afterward, the reaction should be reported to the Vaccine Adverse Event Reporting System (VAERS). Your doctor should file this report, or you can do it yourself through the VAERS web site at www.vaers.SamedayNews.es, or by calling 951-801-7307. ? VAERS does not give medical advice. 6. The National Vaccine Injury Compensation Program The [employer] Vaccine Injury Compensation Program (VICP) is a federal program that was created to  compensate people who may have been injured by certain vaccines. Persons who believe they may have been injured by a vaccine can learn about the program and about filing a claim by calling 281 435 1237 or visiting the [place] website at GoldCloset.com.ee. There is a time limit to file a claim for compensation. 7. How can I learn more?  Ask your healthcare provider. He or she can give you the vaccine package insert or suggest other sources of information.  Call your local or state health department.  Contact the Centers for Disease Control and Prevention (CDC): ? Call (253) 163-9760 (1-800-CDC-INFO) or ? Visit CDC's website at http://hunter.com/ CDC Hepatitis A Vaccine VIS (09/18/2014) This information is not intended to replace advice given to you by your health care provider. Make sure you discuss any questions you have with your health care provider. Document Released: 12/10/2005 Document Revised: 11/06/2015 Document Reviewed: 11/06/2015 Elsevier Interactive Patient Education  2017 [employer].

## 2017-10-13 ENCOUNTER — Ambulatory Visit: Payer: BC Managed Care – PPO | Admitting: Family Medicine

## 2017-10-13 ENCOUNTER — Encounter: Payer: Self-pay | Admitting: Family Medicine

## 2017-10-13 VITALS — BP 80/58 | HR 75 | Temp 98.4°F | Ht 66.0 in | Wt 142.3 lb

## 2017-10-13 DIAGNOSIS — L989 Disorder of the skin and subcutaneous tissue, unspecified: Secondary | ICD-10-CM

## 2017-10-13 NOTE — Progress Notes (Signed)
  HPI:  Using dictation device. Unfortunately this device frequently misinterprets words/phrases.  Two skin concerns today. One white small bump on forehead she thinks has been there for at least 6 months. Mole on occipital area that she hits with her brush. There since childhood. No pruritis or pain. No hx skin cancer, but lots of sun exposure.  ROS: See pertinent positives and negatives per HPI.  Past Medical History:  Diagnosis Date  . Anemia    reports since highschool with remote eval - takes B12 and MV with iron  . Eating disorder    caloric restriction remotely - in college  . Frequent headaches   . History of stomach ulcers    no EGD, ? GERD rather then ulcer  . Migraines   . UTI (urinary tract infection)     Past Surgical History:  Procedure Laterality Date  . HERNIA REPAIR      Family History  Problem Relation Age of Onset  . Cancer Mother   . Hyperlipidemia Father   . COPD Father   . Hyperlipidemia Maternal Grandmother   . Diabetes Maternal Grandfather   . Cancer Paternal Grandmother   . Cancer Paternal Grandfather   . Heart disease Paternal Grandfather     SOCIAL HX: see hpi   Current Outpatient Medications:  .  Cholecalciferol (VITAMIN D PO), Take 800 Units by mouth daily., Disp: , Rfl:  .  Cyanocobalamin (VITAMIN B12 PO), Take by mouth., Disp: , Rfl:  .  levonorgestrel (MIRENA) 20 MCG/24HR IUD, 1 each by Intrauterine route once., Disp: , Rfl:  .  Multiple Vitamins-Minerals (MULTIVITAMIN ADULT PO), Take by mouth., Disp: , Rfl:  .  OVER THE COUNTER MEDICATION, Vitamin C with Zinc, Disp: , Rfl:   EXAM:  Vitals:   10/13/17 0905  BP: (!) 80/58  Pulse: 75  Temp: 98.4 F (36.9 C)    Body mass index is 22.97 kg/m.  GENERAL: vitals reviewed and listed above, alert, oriented, appears well hydrated and in no acute distress  HEENT: atraumatic, conjunttiva clear, no obvious abnormalities on inspection of external nose and ears  NECK: no obvious masses  on inspection  SKIN: small ~ 2-3 mm in diameter sl hypopigmented papule R forehead; bumpy dome shaped, flesh colored lesion approx 8-51mm in diameter L occipital scalp  MS: moves all extremities without noticeable abnormality  PSYCH: pleasant and cooperative, no obvious depression or anxiety  ASSESSMENT AND PLAN:  Discussed the following assessment and plan:  Skin lesion  -we discussed possible serious and likely etiologies, workup and treatment, treatment risks and return precautions - suspect benign -after this discussion, Lydian opted for dermatology evaluation and possible removal scalp lesion as is annoying, catches on brush -she plans to call to schedule appt with dermatology - several options with contact information provided    There are no Patient Instructions on file for this visit.  Lucretia Kern, DO

## 2017-11-10 ENCOUNTER — Ambulatory Visit: Payer: BC Managed Care – PPO | Admitting: Family Medicine

## 2017-11-11 ENCOUNTER — Emergency Department: Payer: BC Managed Care – PPO

## 2017-11-11 ENCOUNTER — Other Ambulatory Visit: Payer: Self-pay

## 2017-11-11 ENCOUNTER — Emergency Department
Admission: EM | Admit: 2017-11-11 | Discharge: 2017-11-11 | Disposition: A | Discharge status: Home / Self Care | Payer: BC Managed Care – PPO | Attending: Emergency Medicine | Admitting: Emergency Medicine

## 2017-11-11 ENCOUNTER — Encounter: Payer: Self-pay | Admitting: *Deleted

## 2017-11-11 DIAGNOSIS — R079 Chest pain, unspecified: Secondary | ICD-10-CM | POA: Diagnosis present

## 2017-11-11 DIAGNOSIS — F329 Major depressive disorder, single episode, unspecified: Secondary | ICD-10-CM | POA: Insufficient documentation

## 2017-11-11 DIAGNOSIS — F411 Generalized anxiety disorder: Secondary | ICD-10-CM | POA: Insufficient documentation

## 2017-11-11 DIAGNOSIS — Z79899 Other long term (current) drug therapy: Secondary | ICD-10-CM | POA: Diagnosis not present

## 2017-11-11 DIAGNOSIS — M7918 Myalgia, other site: Secondary | ICD-10-CM | POA: Diagnosis not present

## 2017-11-11 DIAGNOSIS — R0789 Other chest pain: Secondary | ICD-10-CM | POA: Insufficient documentation

## 2017-11-11 DIAGNOSIS — Z7982 Long term (current) use of aspirin: Secondary | ICD-10-CM | POA: Diagnosis not present

## 2017-11-11 LAB — CBC
HEMATOCRIT: 40 % (ref 35.0–47.0)
Hemoglobin: 13.9 g/dL (ref 12.0–16.0)
MCH: 31.2 pg (ref 26.0–34.0)
MCHC: 34.7 g/dL (ref 32.0–36.0)
MCV: 89.9 fL (ref 80.0–100.0)
Platelets: 165 10*3/uL (ref 150–440)
RBC: 4.45 MIL/uL (ref 3.80–5.20)
RDW: 13.1 % (ref 11.5–14.5)
WBC: 6.4 10*3/uL (ref 3.6–11.0)

## 2017-11-11 LAB — BASIC METABOLIC PANEL
ANION GAP: 8 (ref 5–15)
BUN: 10 mg/dL (ref 6–20)
CO2: 27 mmol/L (ref 22–32)
Calcium: 8.8 mg/dL — ABNORMAL LOW (ref 8.9–10.3)
Chloride: 105 mmol/L (ref 98–111)
Creatinine, Ser: 0.54 mg/dL (ref 0.44–1.00)
Glucose, Bld: 99 mg/dL (ref 70–99)
POTASSIUM: 4.1 mmol/L (ref 3.5–5.1)
SODIUM: 140 mmol/L (ref 135–145)

## 2017-11-11 LAB — TROPONIN I: Troponin I: <0.03 ng/mL (ref <0.03)

## 2017-11-11 LAB — POCT PREGNANCY, URINE: PREG TEST UR: NEGATIVE

## 2017-11-11 NOTE — ED Triage Notes (Signed)
Pt to ED reporting left arm pain, jaw pain and chest tightness that started today. No SOB, lightheadedness and dizziness. No cardiac hx.   Pt took 2 - 81 mg aspirin this afternoon.

## 2017-11-11 NOTE — Discharge Instructions (Signed)
We believe that your symptoms are caused by musculoskeletal strain.  Your blood work, EKG, and chest x-ray were all reassuring and we do not believe that a medical issue such as heart disease is causing her symptoms.  Please read through the included information about additional care such as heating pads, over-the-counter pain medicine.  If you were provided a prescription please use it only as needed and as instructed.  Remember that early mobility and using the affected part of your body is actually better than keeping it immobile.  Follow-up with the doctor listed as recommended or return to the emergency department with new or worsening symptoms that concern you.

## 2017-11-11 NOTE — ED Provider Notes (Signed)
[facility] Emergency Department Provider Note  ____________________________________________   First MD Initiated Contact with Patient 11/11/17 1654     (approximate)  I have reviewed the triage vital signs and the nursing notes.   HISTORY  Chief Complaint Arm Pain and Chest Pain    HPI Virginia Robinson is a 32 y.o. female with medical history as listed below who presents for evaluation of a constellation of symptoms that include some ongoing pain in her left shoulder and arm for the last several weeks with new onset chest tightness that started today.  She is an avid practitioner of yoga and has been dealing with some pain in the left arm and shoulder for an extended period of time likely due to overuse.  It seems to get better and then get worse again from time to time.  This morning she was thinking about the arm and shoulder pain and realized that she was also having some pain in her jaw and a little bit of chest tightness as well.  She checked Web MD and it mentioned that she might be having heart attack and was concerned enough that she felt like it should be checked out.  She denies shortness of breath, fever/chills, nausea, vomiting, and abdominal pain.  She describes the pain in her arm and shoulder as moderate and the other symptoms as mild to moderate.  Nothing particular makes them better or worse except for the left arm and shoulder pain being made worse by doing yoga.  She has no history of cardiac disease and no first-degree relatives who have suffered from ACS.  She has no history of blood clots in her legs or lungs and uses no exogenous estrogen.  She has had no recent immobilizations or surgeries.  Past Medical History:  Diagnosis Date  . Anemia    reports since highschool with remote eval - takes B12 and MV with iron  . Eating disorder    caloric restriction remotely - in college  . Frequent headaches   . History of stomach ulcers    no  EGD, ? GERD rather then ulcer  . Migraines   . UTI (urinary tract infection)     Patient Active Problem List   Diagnosis Date Noted  . IUD (intrauterine device) in place 12/06/2016  . History of anemia 12/06/2016  . GAD (generalized anxiety disorder) 12/06/2016  . Mild depression ([place]) 12/06/2016  . Hx of migraines 12/06/2016    Past Surgical History:  Procedure Laterality Date  . HERNIA REPAIR      Prior to Admission medications   Medication Sig Start Date End Date Taking? Authorizing Provider  aspirin EC 81 MG tablet Take 162 mg by mouth once.   Yes [provider]  Cholecalciferol (VITAMIN D PO) Take 800 Units by mouth daily.   Yes [provider]  Cyanocobalamin (VITAMIN B12 PO) Take 1 tablet by mouth daily.    Yes [provider]  Multiple Vitamins-Minerals (MULTIVITAMIN ADULT PO) Take 1 tablet by mouth daily.    Yes [provider]    Allergies Sulfa antibiotics  Family History  Problem Relation Age of Onset  . Cancer Mother   . Hyperlipidemia Father   . COPD Father   . Hyperlipidemia Maternal Grandmother   . Diabetes Maternal Grandfather   . Cancer Paternal Grandmother   . Cancer Paternal Grandfather   . Heart disease Paternal Grandfather     Social History Social History   Tobacco Use  .  Smoking status: Never Smoker  . Smokeless tobacco: Never Used  Substance Use Topics  . Alcohol use: No    Alcohol/week: 0.0 standard drinks  . Drug use: No    Review of Systems Constitutional: No fever/chills Eyes: No visual changes. ENT: No sore throat. Cardiovascular: Denies chest pain. Respiratory: Denies shortness of breath. Gastrointestinal: No abdominal pain.  No nausea, no vomiting.  No diarrhea.  No constipation. Genitourinary: Negative for dysuria. Musculoskeletal: Pain in the left arm and shoulder as well as some in the back and anterior chest, all of which is resolved except for the arm and shoulder  pain. Integumentary: Negative for rash. Neurological: Negative for headaches, focal weakness or numbness.   ____________________________________________   PHYSICAL EXAM:  VITAL SIGNS: ED Triage Vitals  Enc Vitals Group     BP 11/11/17 1519 120/67     Pulse Rate 11/11/17 1519 77     Resp 11/11/17 1519 (!) 77     Temp 11/11/17 1519 98 F (36.7 C)     Temp Source 11/11/17 1519 Oral     SpO2 11/11/17 1519 100 %     Weight 11/11/17 1520 63 kg (139 lb)     Height 11/11/17 1520 1.702 m (5\' 7" )     Head Circumference --      Peak Flow --      Pain Score 11/11/17 1520 3     Pain Loc --      Pain Edu? --      Excl. in [place]? --     Constitutional: Alert and oriented. Well appearing and in no acute distress. Eyes: Conjunctivae are normal.  Head: Atraumatic. Nose: No congestion/rhinnorhea. Mouth/Throat: Mucous membranes are moist. Neck: No stridor.  No meningeal signs.   Cardiovascular: Normal rate, regular rhythm. Good peripheral circulation. Grossly normal heart sounds. Respiratory: Normal respiratory effort.  No retractions. Lungs CTAB. Gastrointestinal: Healthy body habitus.  Soft and nontender. No distention.  Musculoskeletal: No lower extremity tenderness nor edema. No gross deformities of extremities.  No reproducible left upper anterior chest wall tenderness.  Some reproducible pain or tenderness with range of motion of the left shoulder but it is not clearly the result of a particular movement or direction Neurologic:  Normal speech and language. No gross focal neurologic deficits are appreciated.  Skin:  Skin is warm, dry and intact. No rash noted. Psychiatric: Mood and affect are normal. Speech and behavior are normal.  ____________________________________________   LABS (all labs ordered are listed, but only abnormal results are displayed)  Labs Reviewed  BASIC METABOLIC PANEL - Abnormal; Notable for the following components:      Result Value   Calcium 8.8 (*)     All other components within normal limits  CBC  TROPONIN I  POC URINE PREG, ED  POCT PREGNANCY, URINE   ____________________________________________  EKG  ED ECG REPORT I, Hinda Kehr, the attending physician, personally viewed and interpreted this ECG.  Date: 11/11/2017 EKG Time: 15: 24 Rate: 71 Rhythm: normal sinus rhythm QRS Axis: normal Intervals: normal ST/T Wave abnormalities: normal Narrative Interpretation: no evidence of acute ischemia  ____________________________________________  RADIOLOGY I, Hinda Kehr, personally viewed and evaluated these images (plain radiographs) as part of my medical decision making, as well as reviewing the written report by the radiologist.  ED MD interpretation: No indication of acute abnormality on chest x-ray  Official radiology report(s): Dg Chest 2 View  Result Date: 11/11/2017 CLINICAL DATA:  Chest pain EXAM: CHEST - 2 VIEW COMPARISON:  None. FINDINGS: The heart size and mediastinal contours are within normal limits. Both lungs are clear. The visualized skeletal structures are unremarkable. IMPRESSION: No active cardiopulmonary disease. Electronically Signed   By: Rolm Baptise M.D.   On: 11/11/2017 15:55    ____________________________________________   PROCEDURES  Critical Care performed: No   Procedure(s) performed:   Procedures   ____________________________________________   INITIAL IMPRESSION / ASSESSMENT AND PLAN / ED COURSE  As part of my medical decision making, I reviewed the following data within the [place] History obtained from family, Nursing notes reviewed and incorporated, Labs reviewed , EKG interpreted  and Radiograph reviewed     Differential diagnosis includes, but is not limited to, musculoskeletal pain/strain including the possibility of repetitive motion injury or tendinitis, ACS, PE, pneumothorax, pneumonia.  Less likely GI causes of chest pain.  Anxiety or panic attack is  also possible.  The patient is very low risk for ACS based on HEART score and she is PERC negative.  Based on her history I strongly suspect that she is suffering from some repetitive motion injuries or tendinitis associated with her yoga.  I think that then she became increasingly concerned after reading online some of the symptoms or the differential diagnosis that includes chest and back pain.  At this point she is essentially asymptomatic except for the left shoulder.  There is no indication for repeat troponin or additional work-up at this time.  Her labs are all within normal limits, EKG shows no sign of ischemia, and chest x-ray is within normal limits with no sign of acute abnormality.  I encouraged ibuprofen 600 mg 3 times a day with meals and backing off on the yoga.  I provided follow-up information with Dr. Sharlet Salina who may build to provide additional management and she understands and agrees with the plan.  I gave my usual and customary return precautions.     ____________________________________________  FINAL CLINICAL IMPRESSION(S) / ED DIAGNOSES  Final diagnoses:  Musculoskeletal pain  Atypical chest pain     MEDICATIONS GIVEN DURING THIS VISIT:  Medications - No data to display   ED Discharge Orders    None       Note:  This document was prepared using Dragon voice recognition software and may include unintentional dictation errors.    Hinda Kehr, MD 11/11/17 2112

## 2017-12-13 ENCOUNTER — Ambulatory Visit: Payer: BC Managed Care – PPO | Admitting: Family Medicine

## 2017-12-13 ENCOUNTER — Encounter: Payer: Self-pay | Admitting: Family Medicine

## 2017-12-13 VITALS — BP 102/58 | HR 67 | Temp 98.7°F | Ht 67.0 in | Wt 139.1 lb

## 2017-12-13 DIAGNOSIS — M79602 Pain in left arm: Secondary | ICD-10-CM | POA: Diagnosis not present

## 2017-12-13 DIAGNOSIS — Z23 Encounter for immunization: Secondary | ICD-10-CM | POA: Diagnosis not present

## 2017-12-13 NOTE — Progress Notes (Signed)
HPI:  Using dictation device. Unfortunately this device frequently misinterprets words/phrases.  Acute visit for L arm pain: -started in August 2019 with move in yoga class -L biceps region per pt, constant, worse with activities -has tried ice, heat and rest -she was worried at one point after reading on the internet if could be heart related and had EKG and CXR in ER last month for this -continues to hurt, moderate pain, sometimes take asa for the pain -denies weakness, numbness, fevers, malaise, illness, neck pain, decreased ROM  ROS: See pertinent positives and negatives per HPI.  Past Medical History:  Diagnosis Date  . Anemia    reports since highschool with remote eval - takes B12 and MV with iron  . Eating disorder    caloric restriction remotely - in college  . Frequent headaches   . History of stomach ulcers    no EGD, ? GERD rather then ulcer  . Migraines   . UTI (urinary tract infection)     Past Surgical History:  Procedure Laterality Date  . HERNIA REPAIR      Family History  Problem Relation Age of Onset  . Cancer Mother   . Hyperlipidemia Father   . COPD Father   . Hyperlipidemia Maternal Grandmother   . Diabetes Maternal Grandfather   . Cancer Paternal Grandmother   . Cancer Paternal Grandfather   . Heart disease Paternal Grandfather     SOCIAL HX: see hpi   Current Outpatient Medications:  .  Cholecalciferol (VITAMIN D PO), Take 800 Units by mouth daily., Disp: , Rfl:  .  Cyanocobalamin (VITAMIN B12 PO), Take 1 tablet by mouth daily. , Disp: , Rfl:  .  Multiple Vitamins-Minerals (MULTIVITAMIN ADULT PO), Take 1 tablet by mouth daily. , Disp: , Rfl:   EXAM:  Vitals:   12/13/17 1507  BP: (!) 102/58  Pulse: 67  Temp: 98.7 F (37.1 C)    Body mass index is 21.79 kg/m.  GENERAL: vitals reviewed and listed above, alert, oriented, appears well hydrated and in no acute distress  HEENT: atraumatic, conjunttiva clear, no obvious  abnormalities on inspection of external nose and ears  NECK: no obvious masses on inspection  MS: moves all extremities without noticeable abnormality, normal range of motion with head and neck, negative Spurling, no bony tenderness to palpation the cervical or thoracic spine, normal range of motion of both upper extremities, normal strength and sensitivity light touch throughout in the shoulders and upper extremities bilaterally, focal tenderness in the biceps muscle belly and in the rotator cuff attachment to the humerus and the left upper extremity, he has increased pain with activation of the biceps against resistance, she has pain with empty can testing and impingement testing on the left, neurovascularly intact bilaterally distally  PSYCH: pleasant and cooperative, no obvious depression or anxiety  ASSESSMENT AND PLAN:  Discussed the following assessment and plan:  Left arm pain  -we discussed possible serious and likely etiologies, workup and treatment, treatment risks and return precautions; focal reproducible symptoms on exam suggest soft tissue injury, also discuss possibility nerve stretch or irritation - though seems less likely -after this discussion, Diahann opted for formal PT, aleve/OTC symptomatic care as needed, consideration OMM - she will check with insurance/schedule, traveling a lot the next few weeks -follow up advised 1 months, may need referral if persists -of course, we advised T[place]ra  to return or notify a doctor immediately if symptoms worsen or persist or new concerns arise.  Patient Instructions  BEFORE YOU LEAVE: -follow up:  1 month  -We placed a referral for you as discussed to the physical therapist. It usually takes about 1 week to process and schedule this referral. If you have not heard from [place] regarding this appointment in 1 week please contact our office.  -heat, topical menthol (tiger balm) and/or Aleve as needed per instructions for pain  I hope  you are feeling better soon! Seek care promptly if your symptoms worsen, new concerns arise or you are not improving with treatment.       Lucretia Kern, DO

## 2017-12-13 NOTE — Patient Instructions (Signed)
BEFORE YOU LEAVE: -follow up:  1 month  -We placed a referral for you as discussed to the physical therapist. It usually takes about 1 week to process and schedule this referral. If you have not heard from [place] regarding this appointment in 1 week please contact our office.  -heat, topical menthol (tiger balm) and/or Aleve as needed per instructions for pain  I hope you are feeling better soon! Seek care promptly if your symptoms worsen, new concerns arise or you are not improving with treatment.

## 2018-01-12 ENCOUNTER — Ambulatory Visit: Payer: BC Managed Care – PPO | Admitting: Family Medicine

## 2018-03-01 NOTE — L&D Delivery Note (Signed)
Delivery Note  First Stage: Induction of labor: midnight; received 2 doses of Cytotec 43mcg buccal Labor onset: 0757 Analgesia /Anesthesia intrapartum: none SROM at 0800am - clear fluid  Second Stage: Complete dilation at 0818 Onset of pushing at 0820 Augmentation : Pitocin 2 milliunits FHR second stage Category 2  Delivery of a viable, healthy female "Virginia Robinson" at 1039am by Lars Pinks, CNM in Direct OP position. Shoulders and body followed easily. No nuchal cord Cord double clamped after cessation of pulsation, cut by FOB Cord blood sample collected   Third Stage: Placenta delivered via Delena Bali intact with 3 VC @ 1043 Placenta disposition: hospital disposal  Uterine tone firm with massage / bleeding minimal  2nd degree and left vaginal laceration identified and repaired in standard fashion; right labia majus abrasion noted and not repaired due to hemostasis Anesthesia for repair: 1% Lidocaine Repair: 3.0 vicryl Est. Blood Loss (mL): 951OA  Complications: none  Mom to postpartum.  Baby to Couplet care / Skin to Skin.  Newborn: Birth Weight: 7#13.8oz  Apgar Scores: 8, 9 Feeding planned: breast  Lars Pinks, MSN, CNM [facility]

## 2018-03-16 LAB — OB RESULTS CONSOLE RUBELLA ANTIBODY, IGM: Rubella: IMMUNE

## 2018-03-16 LAB — OB RESULTS CONSOLE HIV ANTIBODY (ROUTINE TESTING): HIV: NONREACTIVE

## 2018-03-16 LAB — OB RESULTS CONSOLE HEPATITIS B SURFACE ANTIGEN: Hepatitis B Surface Ag: NEGATIVE

## 2018-03-16 LAB — OB RESULTS CONSOLE RPR: RPR: NONREACTIVE

## 2018-04-11 LAB — OB RESULTS CONSOLE GC/CHLAMYDIA
Chlamydia: NEGATIVE
Gonorrhea: NEGATIVE

## 2018-06-02 ENCOUNTER — Ambulatory Visit (INDEPENDENT_AMBULATORY_CARE_PROVIDER_SITE_OTHER): Payer: BC Managed Care – PPO | Admitting: Family Medicine

## 2018-06-02 ENCOUNTER — Telehealth: Payer: Self-pay | Admitting: Family Medicine

## 2018-06-02 ENCOUNTER — Ambulatory Visit: Payer: Self-pay

## 2018-06-02 ENCOUNTER — Other Ambulatory Visit: Payer: Self-pay

## 2018-06-02 ENCOUNTER — Encounter: Payer: Self-pay | Admitting: Family Medicine

## 2018-06-02 VITALS — BP 110/64 | HR 72

## 2018-06-02 DIAGNOSIS — F419 Anxiety disorder, unspecified: Secondary | ICD-10-CM

## 2018-06-02 DIAGNOSIS — Z3A23 23 weeks gestation of pregnancy: Secondary | ICD-10-CM

## 2018-06-02 NOTE — Telephone Encounter (Signed)
Copied from [place] [id number]. Topic: Appointment Scheduling - Scheduling Inquiry for Clinic >> Jun 02, 2018  6:00 PM Blase Mess A wrote:  Reason for CRM: Patient is calling because she had a webex appointment that was added to the schedule last minute.  Patient received an Email at [place] that her appointment was from 4:00p-5:00p.  Patient joined the meeting at approximately 4:11pm. And has been waiting for someone to join the meeting until now (6:00p).  Patient was told that the office was closed. And someone would contact her on Monday.  Thank you.

## 2018-06-02 NOTE — Telephone Encounter (Signed)
Pt c/o 4-5 days of lightheadedness. Pt c/o not being SOB but feeling that when she takes a breath in she is not getting the air she needs. Pt stated she is not tachypneic or tachycardic. No fever, cough, body aches. Pt feels like her throat feels tight.  Pt is [redacted] weeks pregnant. Pt stated she notified her OB and her OB stated to call her PCP. Pt stated she is walking without difficulty.  Care advice given, pt verbalized understanding. Email verified     Reason for Disposition . [1] MILD dizziness (e.g., walking normally) AND [2] has NOT been evaluated by physician for this  (Exception: dizziness caused by heat exposure, sudden standing, or poor fluid intake)  Answer Assessment - Initial Assessment Questions 1. DESCRIPTION: "Describe your dizziness."     Like she is going to pass out-aura -  2. LIGHTHEADED: "Do you feel lightheaded?" (e.g., somewhat faint, woozy, weak upon standing)     yes 3. VERTIGO: "Do you feel like either you or the room is spinning or tilting?" (i.e. vertigo)     no 4. SEVERITY: "How bad is it?"  "Do you feel like you are going to faint?" "Can you stand and walk?"   - MILD - walking normally   - MODERATE - interferes with normal activities (e.g., work, school)    - SEVERE - unable to stand, requires support to walk, feels like passing out now.      mild 5. ONSET:  "When did the dizziness begin?"     Last Thursday 6. AGGRAVATING FACTORS: "Does anything make it worse?" (e.g., standing, change in head position)     Sitting  7. HEART RATE: "Can you tell me your heart rate?" "How many beats in 15 seconds?"  (Note: not all patients can do this)       68 8. CAUSE: "What do you think is causing the dizziness?"     Came along with feeling not breathing enough air in 9. RECURRENT SYMPTOM: "Have you had dizziness before?" If so, ask: "When was the last time?" "What happened that time?"     Yes-anemia few years ago- took iron supplement 10. OTHER SYMPTOMS: "Do you have any  other symptoms?" (e.g., fever, chest pain, vomiting, diarrhea, bleeding)       Feels like not getting enough air in but not SOB, throat feels tight 11. PREGNANCY: "Is there any chance you are pregnant?" "When was your last menstrual period?"       [redacted] weeks pregnant  Protocols used: DIZZINESS Heidi Dach

## 2018-06-02 NOTE — Telephone Encounter (Signed)
I would prefer her gyn to see her but if not possible you can added now. Thanks, BJ

## 2018-06-02 NOTE — Progress Notes (Signed)
Virtual Visit via Telephone Note  I connected with Virginia Robinson on 06/02/18 at  3:00 PM EDT by telephone and verified that I am speaking with the correct person using two identifiers.   I discussed the limitations, risks, security and privacy concerns of performing an evaluation and management service by telephone and the availability of in person appointments. I also discussed with the patient that there may be a patient responsible charge related to this service. The patient expressed understanding and agreed to proceed.  Location patient: home Location provider:home office Participants present for the call: patient, provider Patient did not have a visit in the prior 7 days to address this/these issue(s).   History of Present Illness: Virginia Robinson is a 33 yo female, she is [redacted] weeks pregnant. She is concerned because for the past "few days." she has had intermittent lightheadedness. She has some difficulty describing symptoms. States that she has not had dyspnea,cough,or wheezing but still she feels like she is "not satisfied" with air she can inhale. No limitations in physical activity,states that she could go for a walk now.  + Palpitations. She has checked HR and between 68-80's. "Constricted" throat sensation,intermittent. Mild nasal congestion,not different. Denies odynophagia or dysphagia.  She denies heaache,visual changes,neck pain, chest pain,diaphoresis, abdominal pain,nauusea,vomiting,vaginal bleeding,discharge,or urinary symptoms.  Symptoms remind her a "panic attack",she has had similar episodes in the apst but none since she has been pregnant.  Denies suicidal thoughts or crying spells.  No sick contact,travel,or visitors with Hx of travel.  She has not tried OTC treatments. States that she called her ob/gyn and was sent to the ER,she prefers not to go,afraid of sick contact.    Observations/Objective: Patient sounds cheerful and well on the phone. A friend  with her now checked throat with a light and reports no erythema or changes in throat. I do not appreciate any SOB or stridor. Speech and thought processing are grossly intact. She is anxious.  Patient reported vitals:BP 110/64 HR 72/min.  Assessment and Plan:  Anxiety,unspecific type: Explained that symptoms are unspecific but do not suggest a serious process. She is reporting symptoms as stable and not interfering with her normal activities. I do not think she needs to go to the ER at this time but she needs to be vigilant to changes. Recommend continue monitoring temp,BP,and HR. Monitor for new sxs. Relaxation exercises may help.  If symptoms get worse she needs to go to ER. She voices understanding and agrees with plan.  [redacted] weeks gestation of pregnancy No symptoms that indicate complications. Recommend increasing fluids intake and rest. Clearly instructed about warning signs.  Follow Up Instructions:   Tel encounter time 25 min.  I did not refer this patient for an OV in the next 24 hours for this/these issue(s).  I discussed the assessment and treatment plan with the patient. The patient was provided an opportunity to ask questions and all were answered. The patient agreed with the plan and demonstrated an understanding of the instructions.   The patient was advised to call back or seek an in-person evaluation if the symptoms worsen or if the condition fails to improve as anticipated.    Return in about 5 days (around 06/07/2018) for anxiety.     Steffie Waggoner Martinique, MD

## 2018-06-02 NOTE — Telephone Encounter (Signed)
Will forward to Dr Martinique to see if able to add on at 3:30 today for an appt.

## 2018-06-05 NOTE — Telephone Encounter (Signed)
-----   Message from Betty G Martinique, MD sent at 06/02/2018  6:49 PM EDT ----- Regarding: f/u Can you please arrange f/u appt with Dr Maudie Mercury in 5-7 days.  Thanks, BJ

## 2018-06-05 NOTE — Telephone Encounter (Signed)
Pt was seen 06/02/18 via webex with Dr Martinique Nothing further needed.

## 2018-06-05 NOTE — Telephone Encounter (Signed)
I called the pt and offered to schedule an appt for this week.  Patient stated she will call back to schedule an appt.

## 2018-06-06 ENCOUNTER — Other Ambulatory Visit: Payer: Self-pay

## 2018-06-06 ENCOUNTER — Ambulatory Visit (INDEPENDENT_AMBULATORY_CARE_PROVIDER_SITE_OTHER): Payer: BC Managed Care – PPO | Admitting: Family Medicine

## 2018-06-06 ENCOUNTER — Encounter: Payer: Self-pay | Admitting: Family Medicine

## 2018-06-06 DIAGNOSIS — F419 Anxiety disorder, unspecified: Secondary | ICD-10-CM

## 2018-06-06 DIAGNOSIS — Z3A23 23 weeks gestation of pregnancy: Secondary | ICD-10-CM | POA: Diagnosis not present

## 2018-06-06 NOTE — Progress Notes (Signed)
Virtual Visit via Video Note  I connected with Virginia Robinson on 06/06/18 at  3:30 PM EDT by a video enabled telemedicine application and verified that I am speaking with the correct person using two identifiers.  Location patient: home Location provider:work or home office Persons participating in the virtual visit: patient, provider  I discussed the limitations of evaluation and management by telemedicine and the availability of in person appointments. The patient expressed understanding and agreed to proceed.   HPI:  Anxiety follow up: -she is [redacted] weeks pregnant -she has been anxious overall given the COVID 19 pandemic, she has a history of panic attacks -mild lightheadedness at times and occ feels like her bra is tight - when she called her OB last week for these symptoms she reports they would not let her talk with a doctor and told her she had to go to the ER -she was frustrated and more anxious about this because she did not want to go to the ER for her mild symptoms in light of the COVID 19 pandemic, she feels these are normal pregnancy symptoms and wants to talk with her ob -feels better the last three days -pulse ox has been around 98, HR 65-90, reports temp has been completely normal around 98.6 - is checking all of these multiple times per day -no cough, CP, fevers or SOB -she has not been around anyone  ROS: See pertinent positives and negatives per HPI.  Past Medical History:  Diagnosis Date  . Anemia    reports since highschool with remote eval - takes B12 and MV with iron  . Eating disorder    caloric restriction remotely - in college  . Frequent headaches   . History of stomach ulcers    no EGD, ? GERD rather then ulcer  . Migraines   . UTI (urinary tract infection)     Past Surgical History:  Procedure Laterality Date  . HERNIA REPAIR      Family History  Problem Relation Age of Onset  . Cancer Mother   . Hyperlipidemia Father   . COPD Father   . Hyperlipidemia  Maternal Grandmother   . Diabetes Maternal Grandfather   . Cancer Paternal Grandmother   . Cancer Paternal Grandfather   . Heart disease Paternal Grandfather     SOCIAL HX: see hpi   Current Outpatient Medications:  .  Cholecalciferol (VITAMIN D PO), Take 800 Units by mouth daily., Disp: , Rfl:  .  Cyanocobalamin (VITAMIN B12 PO), Take 1 tablet by mouth daily. , Disp: , Rfl:  .  Prenatal Vit-Fe Fumarate-FA (PRENATAL PO), Take by mouth., Disp: , Rfl:   EXAM:  VITALS per patient if applicable: O2 41%, HR 65, Temp 98.6  GENERAL: alert, oriented, appears well and in no acute distress  HEENT: atraumatic, conjunttiva clear, no obvious abnormalities on inspection of external nose and ears  NECK: normal movements of the head and neck  LUNGS: on inspection no signs of respiratory distress, breathing rate appears normal, no obvious gross SOB, gasping or wheezing  CV: no obvious cyanosis  MS: moves all visible extremities without noticeable abnormality  PSYCH/NEURO: pleasant and cooperative, no obvious depression or anxiety, speech and thought processing grossly intact  ASSESSMENT AND PLAN:  Discussed the following assessment and plan:  Anxiety  [redacted] weeks gestation of pregnancy  -we discussed possible serious and likely etiologies, workup and treatment, treatment risks and return precautions; most likely from normal pregnancy and anxiety, but discuss other causes and given  is pregnant advised evaluation with her obstetrician. Has no SOB or CP or fever or other symptoms c/w COVID19, or an emergency so at this point I agree with her that and ER visit risks may outweigh benefits and she should be seen by OB. Did discuss COVID19 can not be ruled out 100% given there are even asymptomatic carriers. We also discuss other serious causes of the symptoms she described. -after this discussion, Virginia Robinson opted for calling her ob back to request a virtual visit -follow up advised he as needed  -of  course, we advised Virginia Robinson  to return or notify a doctor immediately if symptoms worsen or persist or new concerns arise.   I discussed the assessment and treatment plan with the patient. The patient was provided an opportunity to ask questions and all were answered. The patient agreed with the plan and demonstrated an understanding of the instructions.   The patient was advised to call back or seek an in-person evaluation if the symptoms worsen or if the condition fails to improve as anticipated.   Virginia Kern, DO

## 2018-09-18 LAB — OB RESULTS CONSOLE GBS: GBS: NEGATIVE

## 2018-09-27 ENCOUNTER — Other Ambulatory Visit: Payer: Self-pay | Admitting: Obstetrics and Gynecology

## 2018-09-29 ENCOUNTER — Inpatient Hospital Stay (HOSPITAL_COMMUNITY)
Admission: RE | Admit: 2018-09-29 | Discharge: 2018-09-30 | DRG: 807 | Disposition: A | Discharge status: Home / Self Care | Payer: BC Managed Care – PPO | Attending: Obstetrics & Gynecology | Admitting: Obstetrics & Gynecology

## 2018-09-29 ENCOUNTER — Encounter (HOSPITAL_COMMUNITY): Payer: Self-pay

## 2018-09-29 ENCOUNTER — Other Ambulatory Visit: Payer: Self-pay

## 2018-09-29 DIAGNOSIS — Z3A40 40 weeks gestation of pregnancy: Secondary | ICD-10-CM

## 2018-09-29 DIAGNOSIS — Z3490 Encounter for supervision of normal pregnancy, unspecified, unspecified trimester: Secondary | ICD-10-CM | POA: Diagnosis present

## 2018-09-29 DIAGNOSIS — Z20828 Contact with and (suspected) exposure to other viral communicable diseases: Secondary | ICD-10-CM | POA: Diagnosis present

## 2018-09-29 DIAGNOSIS — D6959 Other secondary thrombocytopenia: Secondary | ICD-10-CM | POA: Diagnosis present

## 2018-09-29 DIAGNOSIS — O9912 Other diseases of the blood and blood-forming organs and certain disorders involving the immune mechanism complicating childbirth: Principal | ICD-10-CM | POA: Diagnosis present

## 2018-09-29 LAB — CBC
HCT: 38.4 % (ref 36.0–46.0)
HCT: 40.2 % (ref 36.0–46.0)
Hemoglobin: 13.2 g/dL (ref 12.0–15.0)
Hemoglobin: 13.8 g/dL (ref 12.0–15.0)
MCH: 31.7 pg (ref 26.0–34.0)
MCH: 32.1 pg (ref 26.0–34.0)
MCHC: 34.4 g/dL (ref 30.0–36.0)
MCHC: 34.3 g/dL (ref 30.0–36.0)
MCV: 92.3 fL (ref 80.0–100.0)
MCV: 93.5 fL (ref 80.0–100.0)
Platelets: 121 10*3/uL — ABNORMAL LOW (ref 150–400)
Platelets: 125 10*3/uL — ABNORMAL LOW (ref 150–400)
RBC: 4.16 MIL/uL (ref 3.87–5.11)
RBC: 4.3 MIL/uL (ref 3.87–5.11)
RDW: 13.1 % (ref 11.5–15.5)
RDW: 13.2 % (ref 11.5–15.5)
WBC: 8.6 10*3/uL (ref 4.0–10.5)
WBC: 10.1 10*3/uL (ref 4.0–10.5)
nRBC: 0 % (ref 0.0–0.2)
nRBC: 0 % (ref 0.0–0.2)

## 2018-09-29 LAB — TYPE AND SCREEN
ABO/RH(D): A POS
Antibody Screen: NEGATIVE

## 2018-09-29 LAB — SARS CORONAVIRUS 2 BY RT PCR (HOSPITAL ORDER, PERFORMED IN ~~LOC~~ HOSPITAL LAB): SARS Coronavirus 2: NEGATIVE

## 2018-09-29 LAB — ABO/RH: ABO/RH(D): A POS

## 2018-09-29 LAB — RPR: RPR Ser Ql: NONREACTIVE

## 2018-09-29 MED ORDER — LIDOCAINE HCL (PF) 1 % IJ SOLN
30.0000 mL | INTRAMUSCULAR | Status: AC | PRN
  Administered 2018-09-29: 30 mL via SUBCUTANEOUS
  Filled 2018-09-29: qty 30

## 2018-09-29 MED ORDER — COCONUT OIL OIL
1.0000 "application " | TOPICAL_OIL | Status: DC | PRN
  Administered 2018-09-29: 1 via TOPICAL

## 2018-09-29 MED ORDER — LACTATED RINGERS IV SOLN
INTRAVENOUS | Status: DC
  Administered 2018-09-29 (×2): via INTRAVENOUS

## 2018-09-29 MED ORDER — OXYCODONE HCL 5 MG PO TABS: 10.0000 mg | ORAL_TABLET | ORAL | Status: DC | PRN

## 2018-09-29 MED ORDER — ACETAMINOPHEN 325 MG PO TABS
650.0000 mg | ORAL_TABLET | ORAL | Status: DC | PRN
  Administered 2018-09-29 – 2018-09-30 (×2): 650 mg via ORAL
  Filled 2018-09-29 (×2): qty 2

## 2018-09-29 MED ORDER — EPHEDRINE 5 MG/ML INJ: 10.0000 mg | INTRAVENOUS | Status: DC | PRN

## 2018-09-29 MED ORDER — OXYCODONE HCL 5 MG PO TABS: 5.0000 mg | ORAL_TABLET | ORAL | Status: DC | PRN

## 2018-09-29 MED ORDER — FENTANYL-BUPIVACAINE-NACL 0.5-0.125-0.9 MG/250ML-% EP SOLN
12.0000 mL/h | EPIDURAL | Status: DC | PRN
  Filled 2018-09-29: qty 250

## 2018-09-29 MED ORDER — PRENATAL MULTIVITAMIN CH
1.0000 | ORAL_TABLET | Freq: Every day | ORAL | Status: DC
  Administered 2018-09-29 – 2018-09-30 (×2): 1 via ORAL
  Filled 2018-09-29 (×2): qty 1

## 2018-09-29 MED ORDER — LACTATED RINGERS IV SOLN: 500.0000 mL | INTRAVENOUS | Status: DC | PRN

## 2018-09-29 MED ORDER — OXYTOCIN 40 UNITS IN NORMAL SALINE INFUSION - SIMPLE MED
1.0000 m[IU]/min | INTRAVENOUS | Status: DC
  Administered 2018-09-29: 2 m[IU]/min via INTRAVENOUS

## 2018-09-29 MED ORDER — SENNOSIDES-DOCUSATE SODIUM 8.6-50 MG PO TABS
2.0000 | ORAL_TABLET | ORAL | Status: DC
  Administered 2018-09-29: 2 via ORAL
  Filled 2018-09-29: qty 2

## 2018-09-29 MED ORDER — ONDANSETRON HCL 4 MG/2ML IJ SOLN: 4.0000 mg | INTRAMUSCULAR | Status: DC | PRN

## 2018-09-29 MED ORDER — DIPHENHYDRAMINE HCL 25 MG PO CAPS: 25.0000 mg | ORAL_CAPSULE | Freq: Four times a day (QID) | ORAL | Status: DC | PRN

## 2018-09-29 MED ORDER — ACETAMINOPHEN 325 MG PO TABS: 650.0000 mg | ORAL_TABLET | ORAL | Status: DC | PRN

## 2018-09-29 MED ORDER — TERBUTALINE SULFATE 1 MG/ML IJ SOLN: 0.2500 mg | Freq: Once | INTRAMUSCULAR | Status: DC | PRN

## 2018-09-29 MED ORDER — PHENYLEPHRINE 40 MCG/ML (10ML) SYRINGE FOR IV PUSH (FOR BLOOD PRESSURE SUPPORT): 80.0000 ug | PREFILLED_SYRINGE | INTRAVENOUS | Status: DC | PRN

## 2018-09-29 MED ORDER — DIBUCAINE (PERIANAL) 1 % EX OINT: 1.0000 "application " | TOPICAL_OINTMENT | CUTANEOUS | Status: DC | PRN

## 2018-09-29 MED ORDER — SOD CITRATE-CITRIC ACID 500-334 MG/5ML PO SOLN: 30.0000 mL | ORAL | Status: DC | PRN

## 2018-09-29 MED ORDER — WITCH HAZEL-GLYCERIN EX PADS: 1.0000 "application " | MEDICATED_PAD | CUTANEOUS | Status: DC | PRN

## 2018-09-29 MED ORDER — SIMETHICONE 80 MG PO CHEW: 80.0000 mg | CHEWABLE_TABLET | ORAL | Status: DC | PRN

## 2018-09-29 MED ORDER — ZOLPIDEM TARTRATE 5 MG PO TABS: 5.0000 mg | ORAL_TABLET | Freq: Every evening | ORAL | Status: DC | PRN

## 2018-09-29 MED ORDER — OXYTOCIN BOLUS FROM INFUSION
500.0000 mL | Freq: Once | INTRAVENOUS | Status: AC
  Administered 2018-09-29: 500 mL via INTRAVENOUS

## 2018-09-29 MED ORDER — BENZOCAINE-MENTHOL 20-0.5 % EX AERO
1.0000 "application " | INHALATION_SPRAY | CUTANEOUS | Status: DC | PRN
  Administered 2018-09-29: 1 via TOPICAL
  Filled 2018-09-29: qty 56

## 2018-09-29 MED ORDER — ONDANSETRON HCL 4 MG PO TABS: 4.0000 mg | ORAL_TABLET | ORAL | Status: DC | PRN

## 2018-09-29 MED ORDER — OXYTOCIN 40 UNITS IN NORMAL SALINE INFUSION - SIMPLE MED
2.5000 [IU]/h | INTRAVENOUS | Status: DC
  Administered 2018-09-29: 11:00:00 2.5 [IU]/h via INTRAVENOUS
  Filled 2018-09-29: qty 1000

## 2018-09-29 MED ORDER — ONDANSETRON HCL 4 MG/2ML IJ SOLN: 4.0000 mg | Freq: Four times a day (QID) | INTRAMUSCULAR | Status: DC | PRN

## 2018-09-29 MED ORDER — MISOPROSTOL 50MCG HALF TABLET
50.0000 ug | ORAL_TABLET | ORAL | Status: DC | PRN
  Administered 2018-09-29 (×2): 50 ug via BUCCAL
  Filled 2018-09-29 (×2): qty 1

## 2018-09-29 MED ORDER — LACTATED RINGERS IV SOLN
500.0000 mL | Freq: Once | INTRAVENOUS | Status: AC
  Administered 2018-09-29: 500 mL via INTRAVENOUS

## 2018-09-29 MED ORDER — DIPHENHYDRAMINE HCL 50 MG/ML IJ SOLN: 12.5000 mg | INTRAMUSCULAR | Status: DC | PRN

## 2018-09-29 MED ORDER — IBUPROFEN 600 MG PO TABS
600.0000 mg | ORAL_TABLET | Freq: Four times a day (QID) | ORAL | Status: DC
  Administered 2018-09-29 – 2018-09-30 (×4): 600 mg via ORAL
  Filled 2018-09-29 (×4): qty 1

## 2018-09-29 NOTE — Progress Notes (Signed)
Late entry note as I was providing continuous labor support. S:  Pushing with contractions. Reports feeling exhausted. Feels like she's not making progress. Has tried pushing in all positions. Handle bars is the most effective.  O:  VS: Blood pressure (!) 111/97, pulse (!) 103, temperature 97.8 F (36.6 C), temperature source Oral, resp. rate 16, height 5\' 7"  (1.702 m), weight 76.1 kg, last menstrual period 12/23/2017.        FHR : baseline 130 bpm / variability moderate / accelerations +15x15 / variable and early decelerations with your contractions        Toco: contractions every 2-51minutes / moderate-strong        Cervix : c/c/caput at +2 - feels asynclitic        Membranes: clear fluid  A: 2nd stage of labor     FHR category 2    GBS negative  P: Start Pitocin at 2 milliunits      Continue pushing     Declined pudendal block at this time     Anticipate NSVD    Lars Pinks, MSN, CNM [facility]

## 2018-09-29 NOTE — Progress Notes (Signed)
Late entry note: assumed care at 7am and in room around 7:20am S:  Requesting epidural; states contractions are too painful   O:  VS: Blood pressure (!) 109/57, pulse 80, temperature 97.8 F (36.6 C), temperature source Oral, resp. rate 16, height 5\' 7"  (1.702 m), weight 76.1 kg, last menstrual period 12/23/2017.        FHR : baseline 125bpm/ variability moderate / accelerations +15x15 / no decelerations        Toco: contractions every 2-3 minutes / moderate-strong        Cervix : at time of entering room RN checking patient and called her 5.5/90/0. While prepping for epidural, pt. Felt rectal pressure she was 9.5/100/0 to +1        Membranes: SROM for clear fluid around 8am  A: Active labor     FHR category 2    GBS negative  P: Pt. Declined epidural and we started pushing    Anticipate NSVD   Lars Pinks, MSN, CNM [facility]

## 2018-09-29 NOTE — H&P (Signed)
OB ADMISSION/ HISTORY & PHYSICAL:  Admission Date: 09/29/2018 12:05 AM  Admit Diagnosis: IOL at 40 weeks for Gestational thrombocytopenia   Virginia Robinson is a 33 y.o. female G1P0 at 37 weeks presenting for induction of labor for gestational thrombocytopenia. Platelet count on 7/27 was 100, now improved to 121.  Prenatal History: G1P0   EDC : 09/29/2018, by Last Menstrual Period and consistent with 8 week sono  Prenatal care at [place] Provider: Lars Pinks, CNM  Prenatal course complicated by  - Hx. Of Pyelonephritis (prior to pregnancy) - no complications during pregnancy  - Gestational thrombocytopenia  - Intermittent first and second trimester spotting with resolution  Prenatal Labs: ABO, Rh: --/--/A POS, A POS Performed at [facility] [address] [place], [place] [address]  [phone]) Antibody: NEG (07/31 0050) Rubella:   Immune RPR:   NR HBsAg:   Negative HIV:   NR Toxo: negative GTT: 91 GBS: Negative (07/20 0000)  Panorama: low risk, XY AFP-1: negative CUS: normal XY anatomy, anterior placenta  Medical / Surgical History :  Past medical history:  Past Medical History:  Diagnosis Date  . Anemia    reports since highschool with remote eval - takes B12 and MV with iron  . Eating disorder    caloric restriction remotely - in college  . Frequent headaches   . History of stomach ulcers    no EGD, ? GERD rather then ulcer  . Migraines   . UTI (urinary tract infection)      Past surgical history:  Past Surgical History:  Procedure Laterality Date  . HERNIA REPAIR      Family History:  Family History  Problem Relation Age of Onset  . Cancer Mother   . Hyperlipidemia Father   . COPD Father   . Hyperlipidemia Maternal Grandmother   . Diabetes Maternal Grandfather   . Cancer Paternal Grandmother   . Cancer Paternal Grandfather   . Heart disease Paternal Grandfather      Social History:  reports that she has never  smoked. She has never used smokeless tobacco. She reports that she does not drink alcohol or use drugs.   Allergies: Sulfa antibiotics    Current Medications at time of admission:  Prior to Admission medications   Medication Sig Start Date End Date Taking? Authorizing Provider  Cholecalciferol (VITAMIN D PO) Take 800 Units by mouth daily.    [provider]  Cyanocobalamin (VITAMIN B12 PO) Take 1 tablet by mouth daily.     [provider]  Prenatal Vit-Fe Fumarate-FA (PRENATAL PO) Take by mouth.    [provider]     Review of Systems: Active FM Irregular BH contractions  No LOF  / SROM No bloody show    Physical Exam:  VS: Blood pressure (!) 94/55, pulse 64, temperature 98.4 F (36.9 C), temperature source Oral, resp. rate 16, height 5\' 7"  (1.702 m), weight 76.1 kg, last menstrual period 12/23/2017.  General: alert and oriented Heart: RRR Lungs: Clear lung fields Abdomen: Gravid, soft and non-tender, non-distended / uterus: gravid, non-tender; EFW by [place] 7#8-8 pounds Extremities: no  edema  Genitalia / VE: Dilation: 2 Effacement (%): 50 Station: -2 Exam by:: Josephine Igo, RN   FHR: baseline rate 135 bpm / variability moderate / accelerations +15x15 / occasional early decelerations TOCO: 1-3  Assessment: [redacted]  weeks gestation Induction stage of labor FHR category 1 GBS negative   Plan:  1. Admit to [facility]  Suites   - Routine labor and delivery orders   - Cytotec 4mcg buccal every 4 hours PRN - s/p 2 doses   - Received call at 7am of patient requesting epidural; borderline tachysystole noted, advised IVF bolus LR 25mL and came to assess   - Pain management: planning unmedicated  2. GBS Negative   - No prophylaxis indicated  3. Postpartum:   - Breastfeeding    - Planning circumcision  4. Anticipate MOD: NSVD   Dr. Benjie Karvonen notified of admission / plan of care  Lars Pinks, MSN, [facility]

## 2018-09-29 NOTE — Lactation Note (Signed)
This note was copied from a baby's chart. Lactation Consultation Note  Patient Name: Virginia Robinson Date: 09/29/2018 Reason for consult: 1st time breastfeeding;Term P1, 8 hour female infant. Per parents, pediatrician informed them that I\infant has slight  tongue tie which is noticeable. LC gave parents referral to physicians who does tongue and lip tie procedures advised to discuss with Pediatrician. Per mom, she feels breastfeeding is okay but she is experiencing  pinching with latch and some stings.   [place] worked with Mom on infant obtaining a deeper latch at breast.  Mom latched infant on left breast using football hold, infant was re-latched a few times and LC notices infant sucks bottom lip inward. [place] worked with suck training and getting infant relax lower jaw by extended it downward, to suckle on glove finger and bottle lip flanged outward. Mom re-latched infant using foot ball hold, LC asked mom to express colostrum on breast prior to latching,wait until infant's mouth is wide, jaw lowered, bringing chin first to breast with chin and nose touching this was repeated multiple times to help mom have a good latch. On third attempt mom latched infant to breast without assistance from [facility].  Per mom, she was not feeling the pinch feeling, mom knows to break latch and re-latch infant if she experiences pain and not a tug at breast and to look at nipple it should be rounded and not pinch when coming off breast. Infant breastfeed for total of 12 minutes he was off and on breast at first until he settled in rthymitic pattern and mom was no longer experienced pain with latch.  Mom knows to ask Nurse or [facility] for assistance with latching infant to breast if needed. Infant was given 1 ml of colostrum on spoon when mom taught back hand expression. Mom knows to breastfeed infant according hunger cues, 8 to 12 times within 24 hours and on demand. Parents will continue to do as much STS as  possible. Reviewed Baby & Me book's Breastfeeding Basics.  Mom made aware of O/P services, breastfeeding support groups, community resources, and our phone # for post-discharge questions.  Maternal Data Formula Feeding for Exclusion: No Has patient been taught Hand Expression?: Yes(Infant given 1 ml by spoon.)  Feeding Feeding Type: Breast Fed  LATCH Score Latch: Repeated attempts needed to sustain latch, nipple held in mouth throughout feeding, stimulation needed to elicit sucking reflex.  Audible Swallowing: A few with stimulation  Type of Nipple: Everted at rest and after stimulation  Comfort (Breast/Nipple): Soft / non-tender  Hold (Positioning): Assistance needed to correctly position infant at breast and maintain latch.  LATCH Score: 7  Interventions Interventions: Breast feeding basics reviewed;Breast compression;Adjust position;Assisted with latch;Support pillows;Skin to skin;Breast massage;Position options;Hand express;Expressed milk;Coconut oil  Lactation Tools Discussed/Used Tools: Coconut oil   Consult Status Consult Status: Follow-up Date: 09/30/18 Follow-up type: In-patient    Vicente Serene 09/29/2018, 6:43 PM

## 2018-09-30 LAB — CBC
HCT: 35.2 % — ABNORMAL LOW (ref 36.0–46.0)
Hemoglobin: 11.9 g/dL — ABNORMAL LOW (ref 12.0–15.0)
MCH: 31.6 pg (ref 26.0–34.0)
MCHC: 33.8 g/dL (ref 30.0–36.0)
MCV: 93.4 fL (ref 80.0–100.0)
Platelets: 117 10*3/uL — ABNORMAL LOW (ref 150–400)
RBC: 3.77 MIL/uL — ABNORMAL LOW (ref 3.87–5.11)
RDW: 13.4 % (ref 11.5–15.5)
WBC: 12 10*3/uL — ABNORMAL HIGH (ref 4.0–10.5)
nRBC: 0 % (ref 0.0–0.2)

## 2018-09-30 MED ORDER — IBUPROFEN 600 MG PO TABS: 600.0000 mg | ORAL_TABLET | Freq: Four times a day (QID) | ORAL | 0 refills | Status: DC

## 2018-09-30 NOTE — Lactation Note (Signed)
This note was copied from a baby's chart. Lactation Consultation Note  Patient Name: Virginia Robinson [id number] Date: 09/30/2018 Reason for consult: Follow-up assessment;Difficult latch;1st time breastfeeding;Primapara;Nipple pain/trauma;Term  1309 LC in to visit with P45 Mom of term baby at 44 hrs old.  Baby at 4.8% weight loss.    Mom has had a lot of pain with latching baby.  Baby noted to have a tight frenulum causing pinching and blistering.  Last night, Mom decided to formula feed baby and let her nipples rest.    Baby had a circumcision and a frenotomy an hour ago.    Mom states baby just latched for a few minutes, but it felt better to her.  She states that baby was pulling more, not pinching.  Baby asleep now.  Mom has a DEBP at home.  Recommended she pump anytime baby is supplemented.  If Mom is breastfeeding, she could pump both breast 15 mins to support a full milk supply.  Shared with parents that baby may need time to be able to latch deeply and transfer milk well.  Mom states she had planned to start pumping at home.  1330- Baby latched to right breast in football hold.  Mom using good positioning and support.  A few swallows identified.  Baby relaxed and latch is wide, and deep onto breast.  Mom states the latch is much more comfortable.   Recommended Mom add some pumping after every other feeding until she sees Pediatrician and has another weight check.   Encouraged Mom to call prn for concerns.  If unable to have a follow-up with LC at [facility] office, to call back for OP appointment.   Interventions Interventions: Breast feeding basics reviewed;Skin to skin;Breast massage;Hand express;DEBP;Coconut oil  Lactation Tools Discussed/Used Tools: Coconut oil   Consult Status Consult Status: Complete Date: 09/30/18 Follow-up type: Call as needed    Broadus John 09/30/2018, 1:09 PM

## 2018-09-30 NOTE — Progress Notes (Signed)
No c/o; stopped nursing for now, will pump until management for baby's "tongue tie" Normal lochia, does have cramping, taking ibuprofen; voiding w/o difficulty  Temp:  [97.9 F (36.6 C)-99 F (37.2 C)] 98.2 F (36.8 C) (08/01 0517) Pulse Rate:  [63-100] 64 (08/01 0517) Resp:  [16-19] 18 (08/01 0517) BP: (94-114)/(44-73) 103/71 (08/01 0517)  A&ox3 nml respirations Abd: soft, nt, fundus firm and 2cm below umb LE: no edema, nt bilat  CBC Latest Ref Rng & Units 09/30/2018 09/29/2018 09/29/2018  WBC 4.0 - 10.5 K/uL 12.0(H) 10.1 8.6  Hemoglobin 12.0 - 15.0 g/dL 11.9(L) 13.8 13.2  Hematocrit 36.0 - 46.0 % 35.2(L) 40.2 38.4  Platelets 150 - 400 K/uL 117(L) 125(L) 121(L)   A/P: ppd 1 s/p svd 1. Doing well, d/c home today; f/u office in 6 wks 2. Borderline anemia - iron rich foods 3. Rubella immune 4. Rh pos

## 2018-09-30 NOTE — Discharge Summary (Signed)
Obstetric Discharge Summary Reason for Admission: induction of labor Prenatal Procedures: none Intrapartum Procedures: spontaneous vaginal delivery Postpartum Procedures: none Complications-Operative and Postpartum: none Hemoglobin  Date Value Ref Range Status  09/30/2018 11.9 (L) 12.0 - 15.0 g/dL Final   HCT  Date Value Ref Range Status  09/30/2018 35.2 (L) 36.0 - 46.0 % Final    Physical Exam:  General: alert, cooperative and no distress Lochia: appropriate Uterine Fundus: firm Incision: n/a DVT Evaluation: No evidence of DVT seen on physical exam.  Discharge Diagnoses: Term Pregnancy-delivered  Discharge Information: Date: 09/30/2018 Activity: pelvic rest Diet: routine Medications: Ibuprofen Condition: stable Instructions: refer to practice specific booklet Discharge to: home   Newborn Data: Live born female  Birth Weight: 7 lb 13.8 oz (3565 g) APGAR: 8, 9  Newborn Delivery   Birth date/time: 09/[birth year]/2018 10:39:00 Delivery type: Vaginal, Spontaneous      Home with mother.  Charyl Bigger 09/30/2018, 11:31 AM

## 2019-03-15 ENCOUNTER — Ambulatory Visit: Payer: BC Managed Care – PPO | Attending: Internal Medicine

## 2019-03-15 ENCOUNTER — Other Ambulatory Visit: Payer: BC Managed Care – PPO

## 2019-03-15 DIAGNOSIS — Z20822 Contact with and (suspected) exposure to covid-19: Secondary | ICD-10-CM

## 2019-03-16 LAB — NOVEL CORONAVIRUS, NAA: SARS-CoV-2, NAA: NOT DETECTED

## 2019-04-09 ENCOUNTER — Ambulatory Visit: Payer: BC Managed Care – PPO | Attending: Internal Medicine

## 2019-04-09 DIAGNOSIS — Z20822 Contact with and (suspected) exposure to covid-19: Secondary | ICD-10-CM

## 2019-04-10 LAB — NOVEL CORONAVIRUS, NAA: SARS-CoV-2, NAA: NOT DETECTED

## 2019-04-12 ENCOUNTER — Telehealth: Payer: Self-pay | Admitting: Family Medicine

## 2019-04-12 NOTE — Telephone Encounter (Signed)
Negative COVID results given. Patient results "NOT Detected." Caller expressed understanding. ° °

## 2019-06-21 ENCOUNTER — Other Ambulatory Visit: Payer: Self-pay

## 2019-06-22 ENCOUNTER — Encounter: Payer: Self-pay | Admitting: Family Medicine

## 2019-06-22 ENCOUNTER — Ambulatory Visit (INDEPENDENT_AMBULATORY_CARE_PROVIDER_SITE_OTHER): Payer: BC Managed Care – PPO | Admitting: Family Medicine

## 2019-06-22 VITALS — BP 112/70 | HR 65 | Temp 97.4°F | Resp 12 | Ht 67.0 in | Wt 143.4 lb

## 2019-06-22 DIAGNOSIS — L989 Disorder of the skin and subcutaneous tissue, unspecified: Secondary | ICD-10-CM

## 2019-06-22 DIAGNOSIS — D229 Melanocytic nevi, unspecified: Secondary | ICD-10-CM

## 2019-06-22 NOTE — Patient Instructions (Signed)
A few things to remember from today's visit:   Continue monitoring for change and avoid direct sun exposure. Big hat and long sleeves. Mole A mole is a colored (pigmented) growth on the skin. Moles are very common. They are usually harmless, but some moles can become cancerous over time. What are the causes? Moles are caused when pigmented skin cells grow together in clusters instead of spreading out in the skin as they normally do. The reason why the skin cells grow together in clusters is not known. What increases the risk? You are more likely to develop a mole if you:  Have family members who have moles.  Are white.  Have blond hair.  Are often outdoors and exposed to the sun.  Received phototherapy when you were a newborn baby.  Are female. What are the signs or symptoms? A mole may be:  Owens Shark or black.  Flat or raised.  Smooth or wrinkled. How is this diagnosed? A mole is diagnosed with a skin exam. If your health care provider thinks a mole may be cancerous, all or part of the mole will be removed for testing (biopsy). How is this treated? Most moles are noncancerous (benign) and do not require treatment. If a mole is found to be cancerous, it will be removed. You may also choose to have a mole removed if it is causing pain or if you do not like the way it looks. Follow these instructions at home: General instructions   Every month, look for new moles and check your existing moles for changes. This is important because a change in a mole can mean that the mole has become cancerous.  ABCDE changes in a mole indicate that you should be evaluated by your health care provider. ABCDE stands for: ? Asymmetry. This means the mole has an irregular shape. It is not round or oval. ? Border. This means the mole has an irregular or bumpy border. ? Color. This means the mole has multiple colors in it, including brown, black, blue, red, or tan. Note that it is normal for moles to get  darker when a woman is pregnant or takes birth control pills. ? Diameter. This means the mole is more than 0.2 inches (6 mm) across. ? Evolving. This refers to any unusual changes or symptoms in the mole, such as pain, itching, stinging, sensitivity, or bleeding.  If you have a large number of moles, see a skin doctor (dermatologist) at least one time every year for a full-body skin check. Lifestyle   When you are outdoors, wear sunscreen with SPF 30 (sun protection factor 30) or higher.  Use an adequate amount of sunscreen to cover exposed areas of skin. Put it on 30 minutes before you go out. Reapply it every 2 hours or anytime you come out of the water.  When you are out in the sun, wear a broad-brimmed hat and clothing that covers your arms and legs. Wear wraparound sunglasses. Contact a health care provider if:  The size, shape, borders, or color of your mole changes.  Your mole, or the skin near the mole, becomes painful, sore, red, or swollen.  Your mole: ? Develops more than one color. ? Itches or bleeds. ? Becomes scaly, sheds skin, or oozes fluid. ? Becomes flat or develops raised areas. ? Becomes hard or soft.  You develop a new mole. Summary  A mole is a colored (pigmented) growth on the skin. Moles are very common. They are usually harmless, but  some moles can become cancerous over time.  Every month, look for new moles and check your existing moles for changes. This is important because a change in a mole can mean that the mole has become cancerous.  If you have a large number of moles, see a skin doctor (dermatologist) at least one time every year for a full-body skin check.  When you are outdoors, wear sunscreen with SPF 30 (sun protection factor 30) or higher. Reapply it every 2 hours or anytime you come out of the water.  Contact a health care provider if you notice changes in a mole or if you develop a new mole. This information is not intended to replace  advice given to you by your health care provider. Make sure you discuss any questions you have with your health care provider. Document Revised: 09/21/2018 Document Reviewed: 07/12/2017 Elsevier Patient Education  [place].    Please be sure medication list is accurate. If a new problem present, please set up appointment sooner than planned today.

## 2019-06-22 NOTE — Progress Notes (Signed)
Chief Complaint  Patient presents with  . Nevus    has some moles that she wants looked at   HPI: Virginia Robinson is a 34 y.o. female, who is here today to have some skin lesions check.  She has some moles on her back and one on occipital scalp.The latter one she has had since childhood, no growth or pigmentation changes.  Hyperpigmented skin lesions on upper back, she has had for years.  She is not sure about growth or color changes.  There is no pain, pruritus, or easy bleeding. Negative for personal and family history of a skin cancer.  Now she uses sunscreen now, no hat usually. In her 60s she had frequent UV exposure with no protection.  Negative for fever, chills, night sweats, abnormal weight loss, cough, dyspnea, abdominal pain, numbness.  Review of Systems  Constitutional: Negative for activity change, appetite change and fatigue.  HENT: Negative for mouth sores, nosebleeds and sore throat.   Gastrointestinal: Negative for nausea and vomiting.  Musculoskeletal: Negative for back pain and myalgias.  Neurological: Negative for weakness and headaches.  Hematological: Negative for adenopathy. Does not bruise/bleed easily.  Rest see pertinent positives and negatives per HPI.  Current Outpatient Medications on File Prior to Visit  Medication Sig Dispense Refill  . Cholecalciferol (VITAMIN D PO) Take 800 Units by mouth daily.    Marland Kitchen ibuprofen (ADVIL) 600 MG tablet Take 1 tablet (600 mg total) by mouth every 6 (six) hours. (Patient not taking: Reported on 06/22/2019) 30 tablet 0  . MAGNESIUM PO Take by mouth daily.     No current facility-administered medications on file prior to visit.     Past Medical History:  Diagnosis Date  . Anemia    reports since highschool with remote eval - takes B12 and MV with iron  . Eating disorder    caloric restriction remotely - in college  . Frequent headaches   . History of stomach ulcers    no EGD, ? GERD rather then  ulcer  . Migraines   . UTI (urinary tract infection)    Allergies  Allergen Reactions  . Sulfa Antibiotics Rash    Social History   Socioeconomic History  . Marital status: Married    Spouse name: Not on file  . Number of children: Not on file  . Years of education: Not on file  . Highest education level: Not on file  Occupational History  . Not on file  Tobacco Use  . Smoking status: Never Smoker  . Smokeless tobacco: Never Used  Substance and Sexual Activity  . Alcohol use: No    Alcohol/week: 0.0 standard drinks  . Drug use: No  . Sexual activity: Yes  Other Topics Concern  . 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 Difficulty of Paying Living Expenses:   Food Insecurity:   . Worried About Charity fundraiser in the Last Year:   . Arboriculturist in the Last Year:   Transportation Needs:   . Film/video editor (Medical):   Marland Kitchen Lack of Transportation (Non-Medical):   Physical Activity:   .  Days of Exercise per Week:   . Minutes of Exercise per Session:   Stress:   . Feeling of Stress :   Social Connections:   . Frequency of Communication with Friends and Family:   . Frequency of Social Gatherings with Friends and Family:   . Attends Religious Services:   . Active Member of Clubs or Organizations:   . Attends Archivist Meetings:   Marland Kitchen Marital Status:     Vitals:   06/22/19 1218  BP: 112/70  Pulse: 65  Resp: 12  Temp: (!) 97.4 F (36.3 C)  SpO2: 98%   Body mass index is 22.46 kg/m.  Physical Exam  Nursing note and vitals reviewed. Constitutional: She is oriented to person, place, and time. She appears well-developed and well-nourished. No distress.  HENT:  Head:  Normocephalic and atraumatic.  Eyes: Conjunctivae are normal.  Cardiovascular: Normal rate.  Respiratory: Effort normal. No respiratory distress.  Musculoskeletal:        General: No edema.  Lymphadenopathy:    She has no cervical adenopathy.  Neurological: She is alert and oriented to person, place, and time. Gait normal.  Skin: Skin is warm. No ecchymosis and no rash noted. No erythema.     Brown rounded,sklightly raised lesion on left upper back. De marked borders, pigmentation otherwise even, about 2 mm.  A 1 mm hyperpigmented lesion,macular,no tender,and defined borders.  See pictures (magnified 2X).  Occipital papular lesion,not pigmented,4 mm, just above left hair line.  Psychiatric: She has a normal mood and affect.  Well groomed, good eye contact.      ASSESSMENT AND PLAN:  Virginia Robinson was seen today for nevus.  Diagnoses and all orders for this visit:  Benign pigmented mole Explained that characteristics of lesions do not suggest malignancy. Recommend continue monitoring for changes. Strongly recommend UV light protection, big hat and long sleeves. I think it is appropriate to follow-up in a year, before if needed.  Skin lesion of scalp Benign most likely. ? Intraepithelial nevus. Explained that this lesions tend to grow and eventually it may need to be removed to avoid trauma/irritation.  Return in about 1 year (around 06/21/2020) for cpe.   Virginia Robinson G. [place], MD  [facility]. Advance office.   A few things to remember from today's visit:  Continue monitoring for change and avoid direct sun exposure. Big hat and long sleeves. Mole A mole is a colored (pigmented) growth on the skin. Moles are very common. They are usually harmless, but some moles can become cancerous over time. What are the causes? Moles are caused when pigmented skin cells grow together in clusters instead of spreading out in the skin as they normally do. The reason why the  skin cells grow together in clusters is not known. What increases the risk? You are more likely to develop a mole if you:  Have family members who have moles.  Are white.  Have blond hair.  Are often outdoors and exposed to the sun.  Received phototherapy when you were a newborn baby.  Are female. What are the signs or symptoms? A mole may be:  Owens Shark or black.  Flat or raised.  Smooth or wrinkled. How is this diagnosed? A mole is diagnosed with a skin exam. If your health care provider thinks a mole may be cancerous, all or part of the mole will be removed for testing (biopsy). How is this treated? Most moles are noncancerous (benign) and do not require treatment. If  a mole is found to be cancerous, it will be removed. You may also choose to have a mole removed if it is causing pain or if you do not like the way it looks. Follow these instructions at home: General instructions   Every month, look for new moles and check your existing moles for changes. This is important because a change in a mole can mean that the mole has become cancerous.  ABCDE changes in a mole indicate that you should be evaluated by your health care provider. ABCDE stands for: ? Asymmetry. This means the mole has an irregular shape. It is not round or oval. ? Border. This means the mole has an irregular or bumpy border. ? Color. This means the mole has multiple colors in it, including brown, black, blue, red, or tan. Note that it is normal for moles to get darker when a woman is pregnant or takes birth control pills. ? Diameter. This means the mole is more than 0.2 inches (6 mm) across. ? Evolving. This refers to any unusual changes or symptoms in the mole, such as pain, itching, stinging, sensitivity, or bleeding.  If you have a large number of moles, see a skin doctor (dermatologist) at least one time every year for a full-body skin check. Lifestyle   When you are outdoors, wear sunscreen with SPF 30  (sun protection factor 30) or higher.  Use an adequate amount of sunscreen to cover exposed areas of skin. Put it on 30 minutes before you go out. Reapply it every 2 hours or anytime you come out of the water.  When you are out in the sun, wear a broad-brimmed hat and clothing that covers your arms and legs. Wear wraparound sunglasses. Contact a health care provider if:  The size, shape, borders, or color of your mole changes.  Your mole, or the skin near the mole, becomes painful, sore, red, or swollen.  Your mole: ? Develops more than one color. ? Itches or bleeds. ? Becomes scaly, sheds skin, or oozes fluid. ? Becomes flat or develops raised areas. ? Becomes hard or soft.  You develop a new mole. Summary  A mole is a colored (pigmented) growth on the skin. Moles are very common. They are usually harmless, but some moles can become cancerous over time.  Every month, look for new moles and check your existing moles for changes. This is important because a change in a mole can mean that the mole has become cancerous.  If you have a large number of moles, see a skin doctor (dermatologist) at least one time every year for a full-body skin check.  When you are outdoors, wear sunscreen with SPF 30 (sun protection factor 30) or higher. Reapply it every 2 hours or anytime you come out of the water.  Contact a health care provider if you notice changes in a mole or if you develop a new mole. This information is not intended to replace advice given to you by your health care provider. Make sure you discuss any questions you have with your health care provider. Document Revised: 09/21/2018 Document Reviewed: 07/12/2017 Elsevier Patient Education  [place].    Please be sure medication list is accurate. If a new problem present, please set up appointment sooner than planned today.

## 2019-09-05 ENCOUNTER — Emergency Department: Payer: BC Managed Care – PPO

## 2019-09-05 ENCOUNTER — Other Ambulatory Visit: Payer: Self-pay

## 2019-09-05 ENCOUNTER — Emergency Department
Admission: EM | Admit: 2019-09-05 | Discharge: 2019-09-05 | Disposition: A | Discharge status: Home / Self Care | Payer: BC Managed Care – PPO | Attending: Emergency Medicine | Admitting: Emergency Medicine

## 2019-09-05 ENCOUNTER — Encounter: Payer: Self-pay | Admitting: *Deleted

## 2019-09-05 DIAGNOSIS — R0789 Other chest pain: Secondary | ICD-10-CM | POA: Diagnosis not present

## 2019-09-05 DIAGNOSIS — R0602 Shortness of breath: Secondary | ICD-10-CM | POA: Diagnosis not present

## 2019-09-05 DIAGNOSIS — M79602 Pain in left arm: Secondary | ICD-10-CM | POA: Diagnosis not present

## 2019-09-05 DIAGNOSIS — R1013 Epigastric pain: Secondary | ICD-10-CM | POA: Diagnosis not present

## 2019-09-05 DIAGNOSIS — R079 Chest pain, unspecified: Secondary | ICD-10-CM | POA: Diagnosis present

## 2019-09-05 LAB — CBC
HCT: 41.3 % (ref 36.0–46.0)
Hemoglobin: 14.2 g/dL (ref 12.0–15.0)
MCH: 30.3 pg (ref 26.0–34.0)
MCHC: 34.4 g/dL (ref 30.0–36.0)
MCV: 88.2 fL (ref 80.0–100.0)
Platelets: 178 10*3/uL (ref 150–400)
RBC: 4.68 MIL/uL (ref 3.87–5.11)
RDW: 12.5 % (ref 11.5–15.5)
WBC: 6.7 10*3/uL (ref 4.0–10.5)
nRBC: 0 % (ref 0.0–0.2)

## 2019-09-05 LAB — POCT PREGNANCY, URINE: Preg Test, Ur: NEGATIVE

## 2019-09-05 LAB — BASIC METABOLIC PANEL
Anion gap: 10 (ref 5–15)
BUN: 14 mg/dL (ref 6–20)
CO2: 25 mmol/L (ref 22–32)
Calcium: 8.9 mg/dL (ref 8.9–10.3)
Chloride: 105 mmol/L (ref 98–111)
Creatinine, Ser: 0.78 mg/dL (ref 0.44–1.00)
GFR calc Af Amer: >60 mL/min (ref >60)
GFR calc non Af Amer: >60 mL/min (ref >60)
Glucose, Bld: 90 mg/dL (ref 70–99)
Potassium: 3.9 mmol/L (ref 3.5–5.1)
Sodium: 140 mmol/L (ref 135–145)

## 2019-09-05 LAB — TROPONIN I (HIGH SENSITIVITY): Troponin I (High Sensitivity): <2 ng/L (ref <18)

## 2019-09-05 MED ORDER — PANTOPRAZOLE SODIUM 20 MG PO TBEC
20.0000 mg | DELAYED_RELEASE_TABLET | Freq: Every day | ORAL | 0 refills | Status: DC
Start: 2019-09-05 — End: 2019-11-29

## 2019-09-05 MED ORDER — IOHEXOL 350 MG/ML SOLN
75.0000 mL | Freq: Once | INTRAVENOUS | Status: AC | PRN
  Administered 2019-09-05: 75 mL via INTRAVENOUS
  Filled 2019-09-05: qty 75

## 2019-09-05 NOTE — ED Notes (Signed)
1 unsuccessful attempt at IV access by this RN

## 2019-09-05 NOTE — ED Provider Notes (Signed)
[facility] Emergency Department Provider Note   ____________________________________________    I have reviewed the triage vital signs and the nursing notes.   HISTORY  Chief Complaint Chest Pain     HPI Virginia Robinson is a 34 y.o. female who presents with complaints of epigastric discomfort, chest discomfort and mild shortness of breath.  Patient notes intermittent epigastric discomfort over the last several days did have pain to the left chest as well as a strange sensation in her left arm as well.  On Monday was seen in urgent care, did have a GI cocktail which seemed to improve epigastric discomfort to some degree.  Has had episodes of intermittent dizziness as well.  No fevers or chills.  No cough.  Has been vaccinated against COVID-19.  Past Medical History:  Diagnosis Date  . Anemia    reports since highschool with remote eval - takes B12 and MV with iron  . Eating disorder    caloric restriction remotely - in college  . Frequent headaches   . History of stomach ulcers    no EGD, ? GERD rather then ulcer  . Migraines   . UTI (urinary tract infection)     Patient Active Problem List   Diagnosis Date Noted  . Encounter for planned induction of labor 09/29/2018  . SVD (spontaneous vaginal delivery) 09/29/2018  . Postpartum care following vaginal delivery (7/31) 09/29/2018  . Second degree perineal laceration 09/29/2018  . IUD (intrauterine device) in place 12/06/2016  . History of anemia 12/06/2016  . GAD (generalized anxiety disorder) 12/06/2016  . Mild depression ([place]) 12/06/2016  . Hx of migraines 12/06/2016    Past Surgical History:  Procedure Laterality Date  . HERNIA REPAIR      Prior to Admission medications   Medication Sig Start Date End Date Taking? Authorizing Provider  Cholecalciferol (VITAMIN D PO) Take 800 Units by mouth daily.    [provider]  ibuprofen (ADVIL) 600 MG tablet Take 1 tablet (600 mg  total) by mouth every 6 (six) hours. Patient not taking: Reported on 06/22/2019 09/30/18   Charyl Bigger, MD  pantoprazole (PROTONIX) 20 MG tablet Take 1 tablet (20 mg total) by mouth daily for 14 days. 09/05/19 09/19/19  Lavonia Drafts, MD     Allergies Sulfa antibiotics  Family History  Problem Relation Age of Onset  . Cancer Mother   . Hyperlipidemia Father   . COPD Father   . Hyperlipidemia Maternal Grandmother   . Diabetes Maternal Grandfather   . Cancer Paternal Grandmother   . Cancer Paternal Grandfather   . Heart disease Paternal Grandfather     Social History Social History   Tobacco Use  . Smoking status: Never Smoker  . Smokeless tobacco: Never Used  Substance Use Topics  . Alcohol use: No    Alcohol/week: 0.0 standard drinks  . Drug use: No    Review of Systems  Constitutional: No fever/chills Eyes: No visual changes.  ENT: No sore throat. Cardiovascular: As above Respiratory: Sensation of having to take a deep breath frequently Gastrointestinal: No abdominal pain.  No nausea, no vomiting.   Genitourinary: Negative for dysuria. Musculoskeletal: No calf pain or swelling Skin: Negative for rash. Neurological: Negative for headaches or weakness   ____________________________________________   PHYSICAL EXAM:  VITAL SIGNS: ED Triage Vitals  Enc Vitals Group     BP 09/05/19 1238 120/71     Pulse Rate 09/05/19 1238 76     Resp  09/05/19 1238 16     Temp 09/05/19 1238 98.7 F (37.1 C)     Temp Source 09/05/19 1238 Oral     SpO2 09/05/19 1238 100 %     Weight 09/05/19 1239 66.7 kg (147 lb)     Height 09/05/19 1239 1.702 m (5\' 7" )     Head Circumference --      Peak Flow --      Pain Score 09/05/19 1238 3     Pain Loc --      Pain Edu? --      Excl. in [place]? --     Constitutional: Alert and oriented  Nose: No congestion/rhinnorhea. Mouth/Throat: Mucous membranes are moist.   Neck:  Painless ROM Cardiovascular: Normal rate, regular rhythm.  Grossly normal heart sounds.  Good peripheral circulation. Respiratory: Normal respiratory effort.  No retractions. Lungs CTAB. Gastrointestinal: Soft and nontender. No distention.  No CVA tenderness.  Musculoskeletal: No lower extremity tenderness nor edema.  Warm and well perfused Neurologic:  Normal speech and language. No gross focal neurologic deficits are appreciated.  Skin:  Skin is warm, dry and intact. No rash noted. Psychiatric: Mood and affect are normal. Speech and behavior are normal.  ____________________________________________   LABS (all labs ordered are listed, but only abnormal results are displayed)  Labs Reviewed  BASIC METABOLIC PANEL  CBC  POC URINE PREG, ED  POCT PREGNANCY, URINE  TROPONIN I (HIGH SENSITIVITY)  TROPONIN I (HIGH SENSITIVITY)   ____________________________________________  EKG  ED ECG REPORT I, Lavonia Drafts, the attending physician, personally viewed and interpreted this ECG.  Date: 09/05/2019  Rhythm: normal sinus rhythm QRS Axis: normal Intervals: normal ST/T Wave abnormalities: normal Narrative Interpretation: no evidence of acute ischemia  ____________________________________________  RADIOLOGY  Chest x-ray reviewed by me, unremarkable, no infiltrate pneumonia or pneumothorax CT angiography chest negative for acute abnormalities ____________________________________________   PROCEDURES  Procedure(s) performed: No  Procedures   Critical Care performed: No ____________________________________________   INITIAL IMPRESSION / ASSESSMENT AND PLAN / ED COURSE  Pertinent labs & imaging results that were available during my care of the patient were reviewed by me and considered in my medical decision making (see chart for details).  Patient presents with epigastric discomfort, chest discomfort as above.  Suspicious for GERD/esophagitis less likely ACS, PE also on the differential.  EKG is quite reassuring.  Lab work  demonstrates normal troponin, normal white blood cell count.  Normal chemistries.  Chest x-ray reviewed by me negative for pneumonia pneumothorax or fluid  Sent for CT angiography which is quite reassuring.  Discussed with patient at length given reassuring work-up appropriate for discharge with outpatient follow-up, will start PPI for suspected GERD, strict return precautions, outpatient follow-up with cardiology.    ____________________________________________   FINAL CLINICAL IMPRESSION(S) / ED DIAGNOSES  Final diagnoses:  Atypical chest pain        Note:  This document was prepared using Dragon voice recognition software and may include unintentional dictation errors.   Lavonia Drafts, MD 09/05/19 1810

## 2019-09-05 NOTE — ED Triage Notes (Signed)
Patient reports substernal chest pressure for 3-4 days as well as dizziness. Patient states pain in now to the left and c/o left arm numbness. Patient states this feels different than a panic attack. Patient was seen at an Urgent Care on Monday and was given a GI cocktail, had an EKG and chest x-ray. Patient states chest pressure was relieved with the GI cocktail.

## 2019-09-07 ENCOUNTER — Ambulatory Visit: Payer: BC Managed Care – PPO | Admitting: Family Medicine

## 2019-09-07 ENCOUNTER — Other Ambulatory Visit: Payer: Self-pay

## 2019-09-07 ENCOUNTER — Encounter: Payer: Self-pay | Admitting: Family Medicine

## 2019-09-07 VITALS — BP 100/60 | HR 95 | Temp 98.5°F | Resp 12 | Ht 67.0 in | Wt 146.5 lb

## 2019-09-07 DIAGNOSIS — H8110 Benign paroxysmal vertigo, unspecified ear: Secondary | ICD-10-CM | POA: Diagnosis not present

## 2019-09-07 DIAGNOSIS — E559 Vitamin D deficiency, unspecified: Secondary | ICD-10-CM

## 2019-09-07 DIAGNOSIS — R5383 Other fatigue: Secondary | ICD-10-CM

## 2019-09-07 DIAGNOSIS — R7989 Other specified abnormal findings of blood chemistry: Secondary | ICD-10-CM

## 2019-09-07 DIAGNOSIS — K219 Gastro-esophageal reflux disease without esophagitis: Secondary | ICD-10-CM | POA: Diagnosis not present

## 2019-09-07 DIAGNOSIS — E538 Deficiency of other specified B group vitamins: Secondary | ICD-10-CM

## 2019-09-07 DIAGNOSIS — E611 Iron deficiency: Secondary | ICD-10-CM

## 2019-09-07 MED ORDER — MECLIZINE HCL 25 MG PO TABS: 25.0000 mg | ORAL_TABLET | Freq: Two times a day (BID) | ORAL | 0 refills | Status: DC | PRN

## 2019-09-07 NOTE — Assessment & Plan Note (Signed)
Continue daily B12 supplementation in multivitamin. Further recommendations will be given according to lab result.

## 2019-09-07 NOTE — Assessment & Plan Note (Signed)
Recent CBC normal. Continue daily multivitamin. Further recommendation will be given according to iron result.

## 2019-09-07 NOTE — Assessment & Plan Note (Signed)
Further recommendation will be given according to 25 OH vitamin D result. For now continue vitamin D3 800 units daily.

## 2019-09-07 NOTE — Patient Instructions (Signed)
A few things to remember from today's visit:   Gastroesophageal reflux disease without esophagitis  Fatigue, unspecified type - Plan: TSH  Benign paroxysmal positional vertigo, unspecified laterality - Plan: meclizine (ANTIVERT) 25 MG tablet  Vitamin D deficiency, unspecified - Plan: VITAMIN D 25 Hydroxy (Vit-D Deficiency, Fractures)  B12 deficiency - Plan: Vitamin B12  Iron deficiency - Plan: Iron  Start Protonix 20 mg before breakfast. Fall prevention., Meclizine at bedtime, it causes drowsiness.  If you need refills please call your pharmacy. Do not use My Chart to request refills or for acute issues that need immediate attention.    Please be sure medication list is accurate. If a new problem present, please set up appointment sooner than planned today.

## 2019-09-07 NOTE — Progress Notes (Signed)
Chief Complaint  Patient presents with  . Fatigue  . Dizziness   HPI: Virginia Robinson is a 34 y.o. female, who is here today complaining of 2 to 3 months of fatigue. She has had fatigue before but this time it is "more than usual." She is sleeping better at eating healthier. Negative for fever,abnormal wt loss,or  night sweats. History of depression and anxiety, she does not think these are causing problem. She does not feel rested when she first gets up in the morning. No known history of OSA.  She reported history of B12 deficiency, iron deficiency, and vitamin D deficiency. History of heavy menses, improved after IUD placement.  She recently started taking daily multivitamin and symptoms improved. She was evaluated in the ER on 09/05/19 because of epigastric pressure and CP. Symptoms have improved.  Work-up otherwise negative. Lab Results  Component Value Date   WBC 6.7 09/05/2019   HGB 14.2 09/05/2019   HCT 41.3 09/05/2019   MCV 88.2 09/05/2019   PLT 178 09/05/2019   Lab Results  Component Value Date   CREATININE 0.78 09/05/2019   BUN 14 09/05/2019   NA 140 09/05/2019   K 3.9 09/05/2019   CL 105 09/05/2019   CO2 25 09/05/2019   Troponin < 2. She has tried OTC Tums and Gas X.  Protonix 20 mg were recommended, she has not started yet. + Nausea, no vomiting. + Acid reflux and heartburn, acidic taste in mouth. Reported history of PUD around 2010, she has not had a upper endoscopy.  7 days ago she started with episodes of dizziness, spinning-like sensation that is "brief." Spinning like sensation exacerbated by head movement, it has happened while in bed and when getting up. Negative for hearing loss,tinnitus,trmor,or syncope.  No recent travel or URI. This is a new problem. It is better today.  Review of Systems  Constitutional: Positive for appetite change. Negative for activity change.  HENT: Negative for mouth sores, nosebleeds, sore throat and  trouble swallowing.   Eyes: Negative for redness and visual disturbance.  Respiratory: Negative for cough, shortness of breath and wheezing.   Cardiovascular: Negative for chest pain, palpitations and leg swelling.  Gastrointestinal:       Negative for changes in bowel habits.  Genitourinary: Negative for decreased urine volume, dysuria and hematuria.  Musculoskeletal: Negative for gait problem and myalgias.  Neurological: Negative for weakness and headaches.  Rest see pertinent positives and negatives per HPI.  Current Outpatient Medications on File Prior to Visit  Medication Sig Dispense Refill  . Cholecalciferol (VITAMIN D PO) Take 800 Units by mouth daily.    . pantoprazole (PROTONIX) 20 MG tablet Take 1 tablet (20 mg total) by mouth daily for 14 days. 30 tablet 0   No current facility-administered medications on file prior to visit.   Past Medical History:  Diagnosis Date  . Anemia    reports since highschool with remote eval - takes B12 and MV with iron  . Eating disorder    caloric restriction remotely - in college  . Frequent headaches   . History of stomach ulcers    no EGD, ? GERD rather then ulcer  . Migraines   . UTI (urinary tract infection)    Allergies  Allergen Reactions  . Sulfa Antibiotics Rash    Social History   Socioeconomic History  . Marital status: Married    Spouse name: Not on file  . Number of children: Not on file  .  Years of education: Not on file  . Highest education level: Not on file  Occupational History  . Not on file  Tobacco Use  . Smoking status: Never Smoker  . Smokeless tobacco: Never Used  Substance and Sexual Activity  . Alcohol use: No    Alcohol/week: 0.0 standard drinks  . Drug use: No  . Sexual activity: Yes  Other Topics Concern  . 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 Difficulty of Paying Living Expenses:   Food Insecurity:   . Worried About Charity fundraiser in the Last Year:   . Arboriculturist in the Last Year:   Transportation Needs:   . Film/video editor (Medical):   Marland Kitchen Lack of Transportation (Non-Medical):   Physical Activity:   . Days of Exercise per Week:   . Minutes of Exercise per Session:   Stress:   . Feeling of Stress :   Social Connections:   . Frequency of Communication with Friends and Family:   . Frequency of Social Gatherings with Friends and Family:   . Attends Religious Services:   . Active Member of Clubs or Organizations:   . Attends Archivist Meetings:   Marland Kitchen Marital Status:     Vitals:   09/07/19 1031  BP: 100/60  Pulse: 95  Resp: 12  Temp: 98.5 F (36.9 C)  SpO2: 96%   Body mass index is 22.95 kg/m.  Physical Exam Vitals and nursing note reviewed.  Constitutional:      General: She is not in acute distress.    Appearance: She is well-developed and normal weight.  HENT:     Head: Normocephalic and atraumatic.     Comments: There is no dizziness elicited with head movement,lying down on examination table ,or getting up     Right Ear: Hearing, tympanic membrane, ear canal and external ear normal.     Left Ear: Hearing, tympanic membrane, ear canal and external ear normal.     Mouth/Throat:     Mouth: Mucous membranes are moist.     Pharynx: Oropharynx is clear.  Eyes:     Conjunctiva/sclera: Conjunctivae normal.     Pupils: Pupils are equal, round, and reactive to light.  Neck:     Thyroid: No thyromegaly or thyroid tenderness.  Cardiovascular:     Rate and Rhythm: Normal rate and regular rhythm.     Heart sounds: No murmur heard.   Pulmonary:     Effort: Pulmonary effort is normal. No respiratory distress.     Breath sounds: Normal breath sounds.   Abdominal:     Palpations: Abdomen is soft. There is no hepatomegaly or mass.     Tenderness: There is no abdominal tenderness.  Lymphadenopathy:     Cervical: No cervical adenopathy.  Skin:    General: Skin is warm.     Findings: No erythema or rash.  Neurological:     General: No focal deficit present.     Mental Status: She is alert and oriented to person, place, and time.     Cranial Nerves: No cranial nerve deficit.     Coordination:  Coordination normal.     Gait: Gait normal.     Deep Tendon Reflexes:     Reflex Scores:      Patellar reflexes are 2+ on the right side and 2+ on the left side. Psychiatric:        Attention and Perception: Attention and perception normal.     Comments: Well groomed, good eye contact.    ASSESSMENT AND PLAN:  Virginia Robinson was seen today for fatigue and dizziness.  Diagnoses and all orders for this visit: Orders Placed This Encounter  Procedures  . Vitamin B12  . VITAMIN D 25 Hydroxy (Vit-D Deficiency, Fractures)  . TSH  . Iron   Lab Results  Component Value Date   TSH 5.54 (H) 09/07/2019   Lab Results  Component Value Date   [id number] 098 09/07/2019    Fatigue, unspecified type We discussed possible etiologies: Systemic illness, immunologic,endocrinology,sleep disorder, psychiatric/psychologic, infectious,medications side effects, and idiopathic. Examination today does not suggest a serious process.  Further recommendations will be given according to lab results.  Gastroesophageal reflux disease without esophagitis Recommend starting Protonix 20 mg. GERD precautions.  Benign paroxysmal positional vertigo, unspecified laterality Episodes of dizziness she is describing suggest vertigo. We discussed dx,prognosis,and treatment options. She agrees with trying Meclizine,some side effects discussed.  -     meclizine (ANTIVERT) 25 MG tablet; Take 1 tablet (25 mg total) by mouth 2 (two) times daily as needed for dizziness.  B12  deficiency Continue daily B12 supplementation in multivitamin. Further recommendations will be given according to lab result.  Vitamin D deficiency, unspecified Further recommendation will be given according to 25 OH vitamin D result. For now continue vitamin D3 800 units daily.  Iron deficiency Recent CBC normal. Continue daily multivitamin. Further recommendation will be given according to iron result.   Return if symptoms worsen or fail to improve.   Eliah Ozawa G. Martinique, MD  [facility]. [place] office.  A few things to remember from today's visit:   Gastroesophageal reflux disease without esophagitis  Fatigue, unspecified type - Plan: TSH  Benign paroxysmal positional vertigo, unspecified laterality - Plan: meclizine (ANTIVERT) 25 MG tablet  Vitamin D deficiency, unspecified - Plan: VITAMIN D 25 Hydroxy (Vit-D Deficiency, Fractures)  B12 deficiency - Plan: Vitamin B12  Iron deficiency - Plan: Iron  Start Protonix 20 mg before breakfast. Fall prevention., Meclizine at bedtime, it causes drowsiness.  If you need refills please call your pharmacy. Do not use My Chart to request refills or for acute issues that need immediate attention.    Please be sure medication list is accurate. If a new problem present, please set up appointment sooner than planned today.

## 2019-09-08 LAB — VITAMIN D 25 HYDROXY (VIT D DEFICIENCY, FRACTURES): Vit D, 25-Hydroxy: 32 ng/mL (ref 30–100)

## 2019-09-08 LAB — IRON: Iron: 100 ug/dL (ref 40–190)

## 2019-09-08 LAB — VITAMIN B12: Vitamin B-12: 568 pg/mL (ref 200–1100)

## 2019-09-08 LAB — TSH: TSH: 5.54 mIU/L — ABNORMAL HIGH

## 2019-09-11 ENCOUNTER — Telehealth: Payer: Self-pay | Admitting: Family Medicine

## 2019-09-11 ENCOUNTER — Other Ambulatory Visit: Payer: Self-pay

## 2019-09-11 DIAGNOSIS — R7989 Other specified abnormal findings of blood chemistry: Secondary | ICD-10-CM

## 2019-09-11 NOTE — Telephone Encounter (Signed)
See result note.  

## 2019-09-11 NOTE — Telephone Encounter (Signed)
Pt is returning call back to get her lab results

## 2019-09-14 ENCOUNTER — Other Ambulatory Visit: Payer: BC Managed Care – PPO

## 2019-09-14 ENCOUNTER — Other Ambulatory Visit: Payer: Self-pay

## 2019-09-14 DIAGNOSIS — R7989 Other specified abnormal findings of blood chemistry: Secondary | ICD-10-CM

## 2019-09-15 LAB — TSH: TSH: 5.45 mIU/L — ABNORMAL HIGH

## 2019-09-25 ENCOUNTER — Ambulatory Visit: Payer: BC Managed Care – PPO | Admitting: Family Medicine

## 2019-10-01 ENCOUNTER — Other Ambulatory Visit: Payer: Self-pay

## 2019-10-01 ENCOUNTER — Ambulatory Visit: Payer: BC Managed Care – PPO | Admitting: Family Medicine

## 2019-10-01 ENCOUNTER — Encounter: Payer: Self-pay | Admitting: Family Medicine

## 2019-10-01 VITALS — BP 120/60 | HR 83 | Temp 98.1°F | Resp 12 | Ht 67.0 in | Wt 144.5 lb

## 2019-10-01 DIAGNOSIS — R1010 Upper abdominal pain, unspecified: Secondary | ICD-10-CM | POA: Diagnosis not present

## 2019-10-01 DIAGNOSIS — K219 Gastro-esophageal reflux disease without esophagitis: Secondary | ICD-10-CM | POA: Insufficient documentation

## 2019-10-01 DIAGNOSIS — E039 Hypothyroidism, unspecified: Secondary | ICD-10-CM

## 2019-10-01 DIAGNOSIS — E038 Other specified hypothyroidism: Secondary | ICD-10-CM | POA: Insufficient documentation

## 2019-10-01 LAB — HEPATIC FUNCTION PANEL
AG Ratio: 2.1 (calc) (ref 1.0–2.5)
ALT: 8 U/L (ref 6–29)
AST: 12 U/L (ref 10–30)
Albumin: 4.4 g/dL (ref 3.6–5.1)
Alkaline phosphatase (APISO): 48 U/L (ref 31–125)
Bilirubin, Direct: 0.1 mg/dL (ref 0.0–0.2)
Globulin: 2.1 g/dL (calc) (ref 1.9–3.7)
Indirect Bilirubin: 0.5 mg/dL (calc) (ref 0.2–1.2)
Total Bilirubin: 0.6 mg/dL (ref 0.2–1.2)
Total Protein: 6.5 g/dL (ref 6.1–8.1)

## 2019-10-01 NOTE — Patient Instructions (Addendum)
A few things to remember from today's visit:  Gastro appt will be arranged. Stop Protonix and monitor for changes, if pain gets worse resume. Gall bladder ultrasound will be arranged.  If you need refills please call your pharmacy. Do not use My Chart to request refills or for acute issues that need immediate attention.    Please be sure medication list is accurate. If a new problem present, please set up appointment sooner than planned today.

## 2019-10-01 NOTE — Progress Notes (Signed)
Chief Complaint  Patient presents with  . Follow-up    still having pressure in upper abdomen, feels contantly full or nauseated.    HPI: [name] is a 34 y.o. female, who is here today complaining of persistent epigastric pressure like pain. She was last seen on 09/07/19, when she was c/o fatigue and following on ER visit. Protonix was started to help with heartburn and acidic taste in mouth. She is not sure if Protonix is helping.  Epigastric pain is mild now, 2-3/10, but 4 days ago pain "bad." Pain sometime radiated to RUQ.  Last episode of abdominal pain was exacerbated by chocolate. Mild nausea, no vomiting. "Lirttle" heartburn.  Sometimes defecation helps with pain. Symptoms are worse at night and better in the morning.  She has tried to avoid certain foods: Tomatoes and coffee. She is now having "normal"bowel movements but has a few days of loose stools. Easy satiety. She has not noted melena or blood in stool. Somethims she has "strange" feeling when swallowing, like something is in her throat. This happen with solids and liquids. This does not seem to be related with epigastric pain.  Her mother was treated for H. Pylori years ago and asked her to request test.  She has been under some stress but seems to be doing better for the past 4 weeks. Trying to use medication to calm down. Denies depression like symptoms.  Abnormal TSH. Negative for cold/heat intolerance,constipation. No hx of hypothyroidism.  Lab Results  Component Value Date   TSH 5.45 (H) 09/14/2019   Review of Systems  Constitutional: Positive for fatigue. Negative for activity change, appetite change and fever.  HENT: Negative for mouth sores and sore throat.   Respiratory: Negative for cough, shortness of breath and wheezing.   Cardiovascular: Negative for chest pain and palpitations.  Gastrointestinal: Negative for abdominal distention.  Genitourinary: Negative for decreased  urine volume, dysuria and hematuria.  Skin: Negative for pallor and rash.  Hematological: Negative for adenopathy. Does not bruise/bleed easily.  Rest see pertinent positives and negatives per HPI.  Current Outpatient Medications on File Prior to Visit  Medication Sig Dispense Refill  . Cholecalciferol (VITAMIN D PO) Take 800 Units by mouth daily.    . meclizine (ANTIVERT) 25 MG tablet Take 1 tablet (25 mg total) by mouth 2 (two) times daily as needed for dizziness. 30 tablet 0  . pantoprazole (PROTONIX) 20 MG tablet Take 1 tablet (20 mg total) by mouth daily for 14 days. 30 tablet 0   No current facility-administered medications on file prior to visit.   Past Medical History:  Diagnosis Date  . Anemia    reports since highschool with remote eval - takes B12 and MV with iron  . Eating disorder    caloric restriction remotely - in college  . Frequent headaches   . History of stomach ulcers    no EGD, ? GERD rather then ulcer  . Migraines   . UTI (urinary tract infection)    Allergies  Allergen Reactions  . Sulfa Antibiotics Rash    Social History   Socioeconomic History  . Marital status: Married    Spouse name: Not on file  . Number of children: Not on file  . Years of education: Not on file  . Highest education level: Not on file  Occupational History  . Not on file  Tobacco Use  . Smoking status: Never Smoker  . Smokeless tobacco: Never Used  Substance and Sexual  Activity  . Alcohol use: No    Alcohol/week: 0.0 standard drinks  . Drug use: No  . Sexual activity: Yes  Other Topics Concern  . 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 Difficulty of Paying Living Expenses:   Food Insecurity:    . Worried About Charity fundraiser in the Last Year:   . Arboriculturist in the Last Year:   Transportation Needs:   . Film/video editor (Medical):   Marland Kitchen Lack of Transportation (Non-Medical):   Physical Activity:   . Days of Exercise per Week:   . Minutes of Exercise per Session:   Stress:   . Feeling of Stress :   Social Connections:   . Frequency of Communication with Friends and Family:   . Frequency of Social Gatherings with Friends and Family:   . Attends Religious Services:   . Active Member of Clubs or Organizations:   . Attends Archivist Meetings:   Marland Kitchen Marital Status:     Vitals:   10/01/19 0923  BP: (!) 120/60  Pulse: 83  Resp: 12  Temp: 98.1 F (36.7 C)  SpO2: 96%   Wt Readings from Last 3 Encounters:  10/01/19 144 lb 8 oz (65.5 kg)  09/07/19 146 lb 8 oz (66.5 kg)  09/05/19 147 lb (66.7 kg)   Body mass index is 22.63 kg/m.  Physical Exam Vitals and nursing note reviewed.  Constitutional:      General: She is not in acute distress.    Appearance: She is well-developed and normal weight. She is not ill-appearing.  HENT:     Head: Normocephalic and atraumatic.     Mouth/Throat:     Mouth: Mucous membranes are moist.     Pharynx: Oropharynx is clear.  Eyes:     General: No scleral icterus.    Conjunctiva/sclera: Conjunctivae normal.  Cardiovascular:     Rate and Rhythm: Normal rate and regular rhythm.     Heart sounds: No murmur heard.   Pulmonary:     Effort: Pulmonary effort is normal. No respiratory distress.     Breath sounds: Normal breath sounds.  Abdominal:     General: Bowel sounds are normal. There is no distension.     Palpations: Abdomen is soft. There is no hepatomegaly or mass.     Tenderness: There is abdominal tenderness (discomfort).  Lymphadenopathy:     Cervical: No cervical adenopathy.     Upper Body:     Right upper body: No supraclavicular adenopathy.     Left upper body: No supraclavicular adenopathy.   Skin:    General: Skin is warm.     Findings: No erythema or rash.  Neurological:     General: No focal deficit present.     Mental Status: She is alert and oriented to person, place, and time.     Gait: Gait normal.  Psychiatric:        Mood and Affect: Affect normal.     Comments: Well groomed, good eye contact.    ASSESSMENT AND PLAN:  Ms. Virginia Robinson was seen today for follow-up.  Diagnoses and all orders  for this visit:  Orders Placed This Encounter  Procedures  . US Abdomen Limited RUQ  . Hepatic function panel  . H. pylori Antibody, IgG  . Ambulatory referral to Gastroenterology   Lab Results  Component Value Date   ALT 8 10/01/2019   AST 12 10/01/2019   ALKPHOS 42 12/06/2016   BILITOT 0.6 10/01/2019    Upper abdominal pain We discussed possible etiologies. ? Dyspepsia,IBS,and gallbladder disease among some. Continue avoiding foods that exacerbate problem. Instructed about warning signs. Further recommendations according to imaging and lab results.  Subclinical hypothyroidism TSH mildly abnormal, I do not think this is causing her symptoms. She agrees to continue monitoring for now.  Gastroesophageal reflux disease without esophagitis She does not feel like Protonix is helping,so discontinue. Resume PPI if symptoms get worse. GERD precautions to continue.   Return if symptoms worsen or fail to improve.   Keshun Berrett G. Martinique, MD  [facility]. [place] office.  Discharge Instructions       A few things to remember from today's visit:  Gastro appt will be arranged. Stop Protonix and monitor for changes, if pain gets worse resume. Gall bladder ultrasound will be arranged.  If you need refills please call your pharmacy. Do not use My Chart to request refills or for acute issues that need immediate attention.    Please be sure medication list is accurate. If a new problem present, please set up appointment sooner than planned  today.

## 2019-10-03 ENCOUNTER — Ambulatory Visit
Admission: RE | Admit: 2019-10-03 | Discharge: 2019-10-03 | Disposition: A | Discharge status: Home / Self Care | Payer: BC Managed Care – PPO | Source: Ambulatory Visit | Attending: Family Medicine | Admitting: Family Medicine

## 2019-10-03 DIAGNOSIS — R1010 Upper abdominal pain, unspecified: Secondary | ICD-10-CM

## 2019-10-04 LAB — H. PYLORI ANTIBODY, IGG: H. pylori, IgG AbS: 0.25 Index Value (ref 0.00–0.79)

## 2019-10-08 ENCOUNTER — Encounter: Payer: Self-pay | Admitting: Family Medicine

## 2019-10-12 ENCOUNTER — Encounter: Payer: Self-pay | Admitting: Gastroenterology

## 2019-10-15 ENCOUNTER — Other Ambulatory Visit: Payer: Self-pay | Admitting: Family Medicine

## 2019-10-15 DIAGNOSIS — L989 Disorder of the skin and subcutaneous tissue, unspecified: Secondary | ICD-10-CM

## 2019-11-29 ENCOUNTER — Ambulatory Visit (INDEPENDENT_AMBULATORY_CARE_PROVIDER_SITE_OTHER): Payer: BC Managed Care – PPO | Admitting: Gastroenterology

## 2019-11-29 ENCOUNTER — Encounter: Payer: Self-pay | Admitting: Gastroenterology

## 2019-11-29 VITALS — BP 106/70 | HR 70 | Ht 67.0 in | Wt 137.1 lb

## 2019-11-29 DIAGNOSIS — R195 Other fecal abnormalities: Secondary | ICD-10-CM | POA: Diagnosis not present

## 2019-11-29 DIAGNOSIS — R1030 Lower abdominal pain, unspecified: Secondary | ICD-10-CM

## 2019-11-29 DIAGNOSIS — R1013 Epigastric pain: Secondary | ICD-10-CM

## 2019-11-29 DIAGNOSIS — R12 Heartburn: Secondary | ICD-10-CM

## 2019-11-29 DIAGNOSIS — R197 Diarrhea, unspecified: Secondary | ICD-10-CM | POA: Diagnosis not present

## 2019-11-29 DIAGNOSIS — R110 Nausea: Secondary | ICD-10-CM

## 2019-11-29 MED ORDER — CLENPIQ 10-3.5-12 MG-GM -GM/160ML PO SOLN
1.0000 | ORAL | 0 refills | Status: DC
Start: 2019-11-29 — End: 2020-01-01

## 2019-11-29 NOTE — Progress Notes (Signed)
Chief Complaint: Upper abdominal pain, heartburn, change in bowel habits  Referring Provider:     Martinique, Betty G, MD   HPI:    Virginia Robinson is a 34 y.o. female referred to the Gastroenterology Clinic for evaluation of multiple GI symptoms, to include upper abdominal/epigastric pain, reflux symptoms, change in bowel habits.    Sxs started late June as chest/lower sternal pressure. Was seen in UC on 09/03/2019 then ER on 09/05/2019, diagnosed with reflux, and started on Protonix. Only minimal improvement despite using x4 weeks, so discontinued PPI. Pain slowly improved. Did have associated sour brash that continues to occur intermittently. No radiation.  Does report that symptoms started after a period of increased stress.  Has since stopped coffee, tomatoes, spicy foods.   Since then developed upper abdominal cramping, dyspepsia, and over the last month with lower abdominal cramping and low back pain that improves with BM.   Also with chang in stools since 08/2018 delivery, described as loose stools.  More recently with loose, yellow stools and "undigeted food". No hematochezia, melena. Weight stable.  No fever, chills, night sweats.  Normal appetite with preserved p.o. intake.  Today, she reports her chest pressure has mostly resolved.  Still with reflux sxs- sourbrash, rare HB. Worse in afternoon. No nocturnal sxs. Nausea without emesis, but improving.   Was diagnosed with ulcer in grad schhol-clinically diagnosed, treated with Zantac. No EGD or imaging.   No prior EGD/colonoscopy.    -09/05/2019: Normal BMP, CBC -09/07/2019: Normal B12, vitamin D, iron.  TSH 5.54 -10/01/2019: Normal liver enzymes, negative H. pylori IgG -10/03/2019: Abdominal ultrasound: Normal  No known family history of CRC, GI malignancy, liver disease, pancreatic disease, or IBD.   Past Medical History:  Diagnosis Date  . Anemia    reports since highschool with remote eval - takes B12 and MV with  iron  . Anxiety   . Depression   . Eating disorder    caloric restriction remotely - in college  . Frequent headaches   . History of stomach ulcers    no EGD, ? GERD rather then ulcer  . Migraines   . UTI (urinary tract infection)      Past Surgical History:  Procedure Laterality Date  . HERNIA REPAIR  2014   [place]    Family History  Problem Relation Age of Onset  . Cancer Mother   . Bladder Cancer Mother   . Kidney disease Mother   . Hyperlipidemia Father   . COPD Father   . Hyperlipidemia Maternal Grandmother   . Diabetes Maternal Grandfather   . Cancer Paternal Grandmother   . Breast cancer Paternal Grandmother   . Cancer Paternal Grandfather   . Heart disease Paternal Grandfather   . Throat cancer Paternal Grandfather   . Breast cancer Maternal Aunt   . Uterine cancer Maternal Aunt   . Breast cancer Maternal Aunt   . Ovarian cancer Maternal Aunt   . Bone cancer Paternal Uncle   . Colon cancer Neg Hx    Social History   Tobacco Use  . Smoking status: Never Smoker  . Smokeless tobacco: Never Used  Vaping Use  . Vaping Use: Never used  Substance Use Topics  . Alcohol use: Yes    Alcohol/week: 0.0 standard drinks    Comment: rarely  . Drug use: No   Current Outpatient Medications  Medication Sig Dispense Refill  . AMBULATORY NON FORMULARY  MEDICATION 1 packet daily. Herbal supplements-powder    . AMBULATORY NON FORMULARY MEDICATION 1 packet daily. [place] element-powder. Takes with tea    . Ascorbic Acid (VITAMIN C PO) Take 1 tablet by mouth daily.    . B Complex Vitamins (VITAMIN B COMPLEX PO) Take 1 tablet by mouth daily.    . Cholecalciferol (VITAMIN D PO) Take 800 Units by mouth daily.    . Multiple Vitamins-Iron (MULTIVITAMIN/IRON PO) Take 1 tablet by mouth daily.    . meclizine (ANTIVERT) 25 MG tablet Take 1 tablet (25 mg total) by mouth 2 (two) times daily as needed for dizziness. (Patient not taking: Reported on 11/29/2019) 30 tablet 0   No  current facility-administered medications for this visit.   Allergies  Allergen Reactions  . Sulfa Antibiotics Rash     Review of Systems: All systems reviewed and negative except where noted in HPI.     Physical Exam:    Wt Readings from Last 3 Encounters:  11/29/19 137 lb 2 oz (62.2 kg)  10/01/19 144 lb 8 oz (65.5 kg)  09/07/19 146 lb 8 oz (66.5 kg)    BP 106/70   Pulse 70   Ht '5\' 7"'  (1.702 m)   Wt 137 lb 2 oz (62.2 kg)   BMI 21.48 kg/m  Constitutional:  Pleasant, in no acute distress. Psychiatric: Normal mood and affect. Behavior is normal. EENT: Pupils normal.  Conjunctivae are normal. No scleral icterus. Neck supple. No cervical LAD. Cardiovascular: Normal rate, regular rhythm. No edema Pulmonary/chest: Effort normal and breath sounds normal. No wheezing, rales or rhonchi. Abdominal: Soft, nondistended, nontender. Bowel sounds active throughout. There are no masses palpable. No hepatomegaly. Neurological: Alert and oriented to person place and time. Skin: Skin is warm and dry. No rashes noted.   ASSESSMENT AND PLAN;   1) Noncardiac chest pain 2) Heartburn 3) Sour brash 4) Dyspepsia 5) Upper abdominal/midepigastric pain 6) Nausea without emesis  -EGD to evaluate for mucosal/luminal pathology with gastric/duodenal biopsies -Evaluation is otherwise been largely unrevealing, to include CBC, CMP, abdominal ultrasound -If endoscopic evaluation unrevealing and symptoms persist, consider additional SIBO testing  7) Change in bowel habits 8) Diarrhea 9) Lower abdominal pain -Given long duration of symptoms and increasingly bothersome nature, plan for colonoscopy to evaluate for mucosal/luminal pathology with random and directed biopsies -Likely benefit from increased dietary fiber with fiber supplement -Discussed overlap between her myriad of GI symptoms and underlying stress  The indications, risks, and benefits of EGD and colonoscopy were explained to the  patient in detail. Risks include but are not limited to bleeding, perforation, adverse reaction to medications, and cardiopulmonary compromise. Sequelae include but are not limited to the possibility of surgery, hositalization, and mortality. The patient verbalized understanding and wished to proceed. All questions answered, referred to scheduler and bowel prep ordered. Further recommendations pending results of the exam.     Lavena Bullion, DO, FACG  11/29/2019, 9:52 AM   Martinique, Betty G, MD

## 2019-11-29 NOTE — Patient Instructions (Addendum)
If you are age 36 or older, your body mass index should be between 23-30. Your Body mass index is 21.48 kg/m. If this is out of the aforementioned range listed, please consider follow up with your Primary Care Provider.  If you are age 35 or younger, your body mass index should be between 19-25. Your Body mass index is 21.48 kg/m. If this is out of the aformentioned range listed, please consider follow up with your Primary Care Provider.   You have been scheduled for an endoscopy and colonoscopy. Please follow the written instructions given to you at your visit today. Please pick up your prep supplies at the pharmacy within the next 1-3 days. If you use inhalers (even only as needed), please bring them with you on the day of your procedure.  We have sent the following medications to your pharmacy for you to pick up at your convenience: Clenpiq  It was a pleasure to see you today!  Virginia Robinson, D.O.

## 2019-12-18 ENCOUNTER — Encounter: Payer: Self-pay | Admitting: Gastroenterology

## 2019-12-31 ENCOUNTER — Telehealth: Payer: Self-pay | Admitting: *Deleted

## 2019-12-31 NOTE — Telephone Encounter (Signed)
Pt called back- has been vaccinated x2

## 2019-12-31 NOTE — Telephone Encounter (Signed)
No record of covid vaccines in chart- LMOM to verify that she has been vaccinated.  Asked her to call us back

## 2020-01-01 ENCOUNTER — Ambulatory Visit (AMBULATORY_SURGERY_CENTER): Payer: BC Managed Care – PPO | Admitting: Gastroenterology

## 2020-01-01 ENCOUNTER — Encounter: Payer: Self-pay | Admitting: Gastroenterology

## 2020-01-01 VITALS — BP 102/57 | HR 66 | Temp 97.9°F | Resp 18 | Ht 67.0 in | Wt 137.0 lb

## 2020-01-01 DIAGNOSIS — K2990 Gastroduodenitis, unspecified, without bleeding: Secondary | ICD-10-CM

## 2020-01-01 DIAGNOSIS — R1030 Lower abdominal pain, unspecified: Secondary | ICD-10-CM | POA: Diagnosis not present

## 2020-01-01 DIAGNOSIS — K317 Polyp of stomach and duodenum: Secondary | ICD-10-CM | POA: Diagnosis not present

## 2020-01-01 DIAGNOSIS — R1013 Epigastric pain: Secondary | ICD-10-CM

## 2020-01-01 DIAGNOSIS — K319 Disease of stomach and duodenum, unspecified: Secondary | ICD-10-CM | POA: Diagnosis not present

## 2020-01-01 DIAGNOSIS — K269 Duodenal ulcer, unspecified as acute or chronic, without hemorrhage or perforation: Secondary | ICD-10-CM | POA: Diagnosis not present

## 2020-01-01 DIAGNOSIS — R194 Change in bowel habit: Secondary | ICD-10-CM | POA: Diagnosis not present

## 2020-01-01 DIAGNOSIS — R195 Other fecal abnormalities: Secondary | ICD-10-CM

## 2020-01-01 DIAGNOSIS — K2950 Unspecified chronic gastritis without bleeding: Secondary | ICD-10-CM

## 2020-01-01 DIAGNOSIS — R12 Heartburn: Secondary | ICD-10-CM | POA: Diagnosis not present

## 2020-01-01 DIAGNOSIS — K2970 Gastritis, unspecified, without bleeding: Secondary | ICD-10-CM | POA: Diagnosis not present

## 2020-01-01 DIAGNOSIS — R197 Diarrhea, unspecified: Secondary | ICD-10-CM

## 2020-01-01 MED ORDER — SODIUM CHLORIDE 0.9 % IV SOLN: 500.0000 mL | Freq: Once | INTRAVENOUS | Status: DC

## 2020-01-01 MED ORDER — PANTOPRAZOLE SODIUM 20 MG PO TBEC: 20.0000 mg | DELAYED_RELEASE_TABLET | Freq: Two times a day (BID) | ORAL | 0 refills | Status: DC

## 2020-01-01 NOTE — Patient Instructions (Addendum)
Handout was given to you on gastritis.  Pantoprazole 20 mg was sent to [facility] [place], [place].   Add Over the counter fiber supplement daily until stool more formed then as needed per Dr. Bryan Lemma. You may resume your current medications today. Await biopsy results. Usually takes 2-3 weeks to receive the pathology results. Please call if any questions or concerns.    YOU HAD AN ENDOSCOPIC PROCEDURE TODAY AT [facility]:   Refer to the procedure report that was given to you for any specific questions about what was found during the examination.  If the procedure report does not answer your questions, please call your gastroenterologist to clarify.  If you requested that your care partner not be given the details of your procedure findings, then the procedure report has been included in a sealed envelope for you to review at your convenience later.  YOU SHOULD EXPECT: Some feelings of bloating in the abdomen. Passage of more gas than usual.  Walking can help get rid of the air that was put into your GI tract during the procedure and reduce the bloating. If you had a lower endoscopy (such as a colonoscopy or flexible sigmoidoscopy) you may notice spotting of blood in your stool or on the toilet paper. If you underwent a bowel prep for your procedure, you may not have a normal bowel movement for a few days.  Please Note:  You might notice some irritation and congestion in your nose or some drainage.  This is from the oxygen used during your procedure.  There is no need for concern and it should clear up in a day or so.  SYMPTOMS TO REPORT IMMEDIATELY:   Following lower endoscopy (colonoscopy or flexible sigmoidoscopy):  Excessive amounts of blood in the stool  Significant tenderness or worsening of abdominal pains  Swelling of the abdomen that is new, acute  Fever of 100F or higher   Following upper endoscopy (EGD)  Vomiting of blood or coffee ground material  New  chest pain or pain under the shoulder blades  Painful or persistently difficult swallowing  New shortness of breath  Fever of 100F or higher  Black, tarry-looking stools  For urgent or emergent issues, a gastroenterologist can be reached at any hour by calling [phone]. Do not use MyChart messaging for urgent concerns.    DIET:  We do recommend a small meal at first, but then you may proceed to your regular diet.  Drink plenty of fluids but you should avoid alcoholic beverages for 24 hours.  ACTIVITY:  You should plan to take it easy for the rest of today and you should NOT DRIVE or use heavy machinery until tomorrow (because of the sedation medicines used during the test).    FOLLOW UP: Our staff will call the number listed on your records 48-72 hours following your procedure to check on you and address any questions or concerns that you may have regarding the information given to you following your procedure. If we do not reach you, we will leave a message.  We will attempt to reach you two times.  During this call, we will ask if you have developed any symptoms of COVID 19. If you develop any symptoms (ie: fever, flu-like symptoms, shortness of breath, cough etc.) before then, please call [phone].  If you test positive for Covid 19 in the 2 weeks post procedure, please call and report this information to [place].    If any biopsies were  taken you will be contacted by phone or by letter within the next 1-3 weeks.  Please call us at [phone] if you have not heard about the biopsies in 3 weeks.    SIGNATURES/CONFIDENTIALITY: You and/or your care partner have signed paperwork which will be entered into your electronic medical record.  These signatures attest to the fact that that the information above on your After Visit Summary has been reviewed and is understood.  Full responsibility of the confidentiality of this discharge information lies with you and/or your care-partner.

## 2020-01-01 NOTE — Progress Notes (Signed)
Called to room to assist during endoscopic procedure.  Patient ID and intended procedure confirmed with present staff. Received instructions for my participation in the procedure from the performing physician.  

## 2020-01-01 NOTE — Op Note (Signed)
[facility] Patient Name: Virginia Robinson Procedure Date: 01/01/2020 7:47 AM MRN: [id number] Endoscopist: Gerrit Heck , MD Age: 34 Referring MD:  Date of Birth: May 09, 1985 Gender: Female Account #: [id number] Procedure:                Colonoscopy Indications:              Lower abdominal pain, Change in bowel habits,                            Diarrhea Medicines:                Monitored Anesthesia Care Procedure:                Pre-Anesthesia Assessment:                           - Prior to the procedure, a History and Physical                            was performed, and patient medications and                            allergies were reviewed. The patient's tolerance of                            previous anesthesia was also reviewed. The risks                            and benefits of the procedure and the sedation                            options and risks were discussed with the patient.                            All questions were answered, and informed consent                            was obtained. Prior Anticoagulants: The patient has                            taken no previous anticoagulant or antiplatelet                            agents. ASA Grade Assessment: II - A patient with                            mild systemic disease. After reviewing the risks                            and benefits, the patient was deemed in                            satisfactory condition to undergo the procedure.  After obtaining informed consent, the colonoscope                            was passed under direct vision. Throughout the                            procedure, the patient's blood pressure, pulse, and                            oxygen saturations were monitored continuously. The                            Colonoscope was introduced through the anus and                            advanced to the the terminal ileum. The colonoscopy                             was performed without difficulty. The patient                            tolerated the procedure well. The quality of the                            bowel preparation was good. The terminal ileum,                            ileocecal valve, appendiceal orifice, and rectum                            were photographed. Scope In: 8:16:24 AM Scope Out: 8:29:26 AM Scope Withdrawal Time: 0 hours 8 minutes 6 seconds  Total Procedure Duration: 0 hours 13 minutes 2 seconds  Findings:                 The perianal and digital rectal examinations were                            normal.                           The colon (entire examined portion) appeared                            normal. Biopsies for histology were taken with a                            cold forceps from the right colon and left colon                            for evaluation of microscopic colitis. Estimated                            blood loss was minimal.  The retroflexed view of the distal rectum and anal                            verge was normal and showed no anal or rectal                            abnormalities.                           The terminal ileum appeared normal. Complications:            No immediate complications. Estimated Blood Loss:     Estimated blood loss was minimal. Impression:               - The entire examined colon is normal. Biopsied.                           - The distal rectum and anal verge are normal on                            retroflexion view.                           - The examined portion of the ileum was normal. Recommendation:           - Patient has a contact number available for                            emergencies. The signs and symptoms of potential                            delayed complications were discussed with the                            patient. Return to normal activities tomorrow.                             Written discharge instructions were provided to the                            patient.                           - Resume previous diet.                           - Continue present medications.                           - Await pathology results.                           - Repeat colonoscopy in 10 years for screening                            purposes.                           -  Return to GI office PRN.                           - Use fiber, for example Citrucel, Fibercon, Konsyl                            or Metamucil. Gerrit Heck, MD 01/01/2020 8:45:32 AM

## 2020-01-01 NOTE — Progress Notes (Signed)
To PACU, vss. Report to [email or web address] 

## 2020-01-01 NOTE — Op Note (Signed)
[facility] Patient Name: Virginia Robinson Procedure Date: 01/01/2020 7:47 AM MRN: [id number] Endoscopist: Gerrit Heck , MD Age: 34 Referring MD:  Date of Birth: 02-May-1985 Gender: Female Account #: [id number] Procedure:                Upper GI endoscopy Indications:              Epigastric abdominal pain, Upper abdominal pain,                            Suspected esophageal reflux, Change in bowel habits Medicines:                Monitored Anesthesia Care Procedure:                Pre-Anesthesia Assessment:                           - Prior to the procedure, a History and Physical                            was performed, and patient medications and                            allergies were reviewed. The patient's tolerance of                            previous anesthesia was also reviewed. The risks                            and benefits of the procedure and the sedation                            options and risks were discussed with the patient.                            All questions were answered, and informed consent                            was obtained. Prior Anticoagulants: The patient has                            taken no previous anticoagulant or antiplatelet                            agents. ASA Grade Assessment: II - A patient with                            mild systemic disease. After reviewing the risks                            and benefits, the patient was deemed in                            satisfactory condition to undergo the procedure.  After obtaining informed consent, the endoscope was                            passed under direct vision. Throughout the                            procedure, the patient's blood pressure, pulse, and                            oxygen saturations were monitored continuously. The                            Endoscope was introduced through the mouth, and                             advanced to the second part of duodenum. The upper                            GI endoscopy was accomplished without difficulty.                            The patient tolerated the procedure well. Scope In: Scope Out: Findings:                 The examined esophagus was normal.                           The Z-line was regular and was found 38 cm from the                            incisors.                           The gastroesophageal flap valve was visualized                            endoscopically and classified as Hill Grade II                            (fold present, opens with respiration).                           Scattered mild inflammation characterized by                            erythema was found in the gastric body and in the                            gastric antrum. Biopsies were taken with a cold                            forceps for Helicobacter pylori testing. Estimated                            blood loss was minimal.  A few small sessile polyps with no stigmata of                            recent bleeding were found in the gastric body.                            These polyps were removed with a cold biopsy                            forceps. Resection and retrieval were complete.                            Estimated blood loss was minimal.                           A single erosion without bleeding was found in the                            duodenal bulb. Biopsies were taken with a cold                            forceps for histology. Estimated blood loss was                            minimal.                           The first portion of the duodenum and second                            portion of the duodenum were normal. Biopsies for                            histology were taken with a cold forceps for                            evaluation of celiac disease. Estimated blood loss                            was  minimal. Complications:            No immediate complications. Estimated Blood Loss:     Estimated blood loss was minimal. Impression:               - Normal esophagus.                           - Z-line regular, 38 cm from the incisors.                           - Gastroesophageal flap valve classified as Hill                            Grade II (fold present, opens with respiration).                           -  Gastritis. Biopsied.                           - A few gastric polyps. Resected and retrieved.                           - Duodenal erosion without bleeding. Biopsied.                           - Normal first portion of the duodenum and second                            portion of the duodenum. Biopsied. Recommendation:           - Patient has a contact number available for                            emergencies. The signs and symptoms of potential                            delayed complications were discussed with the                            patient. Return to normal activities tomorrow.                            Written discharge instructions were provided to the                            patient.                           - Resume previous diet.                           - Continue present medications.                           - Await pathology results.                           - Use Protonix (pantoprazole) 20 mg PO BID for 6                            weeks to promote mucosal healing.                           - Colonoscopy today. Gerrit Heck, MD 01/01/2020 8:42:21 AM

## 2020-01-01 NOTE — Progress Notes (Signed)
No problems noted in the recovery room. maw 

## 2020-01-03 ENCOUNTER — Telehealth: Payer: Self-pay | Admitting: *Deleted

## 2020-01-03 NOTE — Telephone Encounter (Signed)
No answer, left vm 

## 2020-01-03 NOTE — Telephone Encounter (Signed)
No answer for post procedure call back. Left message for patient to call with questions or concerns. 

## 2020-01-08 ENCOUNTER — Encounter: Payer: Self-pay | Admitting: Gastroenterology

## 2020-02-18 ENCOUNTER — Telehealth: Payer: Self-pay | Admitting: Gastroenterology

## 2020-02-18 NOTE — Telephone Encounter (Signed)
Spoke to Patient who continues to have GERD. Would like to discuss other options with Dr Bryan Lemma. Appointment scheduled for 02/20/20

## 2020-02-18 NOTE — Telephone Encounter (Signed)
LMOM for patient to call back.

## 2020-02-18 NOTE — Telephone Encounter (Signed)
Pt is requesting a call back from a nurse to discuss her GI symptoms (upper abdominal pain). Pt would like to know if there is something she could take.

## 2020-02-20 ENCOUNTER — Ambulatory Visit: Payer: BC Managed Care – PPO | Admitting: Gastroenterology

## 2020-02-20 ENCOUNTER — Encounter: Payer: Self-pay | Admitting: Gastroenterology

## 2020-02-20 VITALS — BP 102/64 | HR 67 | Ht 67.0 in | Wt 145.0 lb

## 2020-02-20 DIAGNOSIS — R1013 Epigastric pain: Secondary | ICD-10-CM

## 2020-02-20 DIAGNOSIS — K2970 Gastritis, unspecified, without bleeding: Secondary | ICD-10-CM

## 2020-02-20 DIAGNOSIS — K269 Duodenal ulcer, unspecified as acute or chronic, without hemorrhage or perforation: Secondary | ICD-10-CM | POA: Diagnosis not present

## 2020-02-20 MED ORDER — RIFAXIMIN 550 MG PO TABS: 550.0000 mg | ORAL_TABLET | Freq: Three times a day (TID) | ORAL | 0 refills | Status: AC

## 2020-02-20 NOTE — Patient Instructions (Addendum)
If you are age 36 or older, your body mass index should be between 23-30. Your Body mass index is 22.71 kg/m. If this is out of the aforementioned range listed, please consider follow up with your Primary Care Provider.  If you are age 16 or younger, your body mass index should be between 19-25. Your Body mass index is 22.71 kg/m. If this is out of the aformentioned range listed, please consider follow up with your Primary Care Provider.   We have sent the following medications to your pharmacy for you to pick up at your convenience:  We have sent Rifaximin 550 mg capsules to [facility] who handles all prior authorizations for [place] on this medication. You will hear from them soon in regards to authorization  Please use a probiotic of your choice for 4 weeks. Stop taking your Protinix.  Call the clinic at the end of February 2022 to schedule a 3 month follow up    Due to recent changes in healthcare laws, you may see the results of your imaging and laboratory studies on MyChart before your provider has had a chance to review them.  We understand that in some cases there may be results that are confusing or concerning to you. Not all laboratory results come back in the same time frame and the provider may be waiting for multiple results in order to interpret others.  Please give [place] 48 hours in order for your provider to thoroughly review all the results before contacting the office for clarification of your results.   Thank you for choosing me and [facility].  Vito Cirigliano, D.O.

## 2020-02-20 NOTE — Progress Notes (Signed)
P  Chief Complaint:    GERD  GI History: 34 year old female with a history of chest/lower sternal pressure. Symptoms started in June 2021; was seen in [facility] on 09/03/2019 then ER on 09/05/2019, diagnosed with reflux, and started on Protonix. Only minimal improvement despite using x4 weeks, so discontinued PPI. Pain slowly improved. Did have associated sour brash that continues to occur intermittently. No radiation.  Does report that symptoms started after a period of increased stress. Symptoms have resolved.  Then developed upper abdominal cramping, dyspepsia in September 2021.  Finally, also with change in stools since 08/2018 (after delivery of baby), described as loose stools without hematochezia or melena. Weight stable, good p.o. intake.  -09/05/2019: Normal BMP, CBC -09/07/2019: Normal B12, vitamin D, iron.  TSH 5.54 -10/01/2019: Normal liver enzymes, negative H. pylori IgG -10/03/2019: Abdominal ultrasound: Normal -11/29/2019: Initial appointment in GI clinic -EGD (01/01/2020): Regular Z-line, Hill grade 2, mild gastritis, fundic gland polyps, small duodenal erosion, normal D2 (normal biopsies). Treated with 6-week course of Protonix -Colonoscopy (01/01/2020): Normal with normal TI. Biopsies negative for MC. Repeat 10 years  HPI:     Patient is a 34 y.o. female presenting to the Gastroenterology Clinic for follow-up. Initially seen by me in 10/2019 with subsequent EGD/colonoscopy completed last month. Studies only nf/small duodenal erosion and mild non-H. pylori gastritis. Treated with 6-week course of Protonix.  Today, she states symptoms have been improving (about 70% better) with PPI until about 1.5 weeks ago.  Started having gnawing discomfort in epigastrium and dyspepsia.  Thinks maybe this was related to dietary indiscretion with her recent birthday.  Some associated nausea.  Symptoms occur throughout the day and nocturnal as well.  No heartburn, rare regurgitation.  No cough, hoarseness, raspy  voice, etc.  No dysphagia.  Has stopped coffee, acidic foods.   Stools soft. No GIB.   Otherwise, no new labs or imaging since last appointment.    Review of systems:     No chest pain, no SOB, no fevers, no urinary sx   Past Medical History:  Diagnosis Date  . Anemia    reports since highschool with remote eval - takes B12 and MV with iron  . Anxiety   . Depression   . Eating disorder    caloric restriction remotely - in college  . Frequent headaches   . History of stomach ulcers    no EGD, ? GERD rather then ulcer  . Migraines   . UTI (urinary tract infection)     Patient's surgical history, family medical history, social history, medications and allergies were all reviewed in Epic    Current Outpatient Medications  Medication Sig Dispense Refill  . B Complex Vitamins (VITAMIN B COMPLEX PO) Take 1 tablet by mouth daily.    . Cholecalciferol (VITAMIN D PO) Take 800 Units by mouth daily.    . Multiple Vitamins-Iron (MULTIVITAMIN/IRON PO) Take 1 tablet by mouth daily.    . pantoprazole (PROTONIX) 20 MG tablet Take 1 tablet (20 mg total) by mouth 2 (two) times daily. 30-60 minutes before food.  Best to take on an empty stomach. 90 tablet 0   No current facility-administered medications for this visit.    Physical Exam:     BP 102/64 (BP Location: Right Arm, Patient Position: Sitting, Cuff Size: Normal)   Pulse 67   Ht 5\' 7"  (1.702 m)   Wt 145 lb (65.8 kg)   BMI 22.71 kg/m   GENERAL:  Pleasant female in NAD  PSYCH: : Cooperative, normal affect ABDOMEN:  Nondistended, soft, nontender. No obvious masses, no hepatomegaly,  normal bowel sounds SKIN:  turgor, no lesions seen Musculoskeletal:  Normal muscle tone, normal strength NEURO: Alert and oriented x 3, no focal neurologic deficits   IMPRESSION and PLAN:    1) Dyspepsia 2) Epigastric pain 3) Duodenal erosion 4) Non-H. pylori gastritis  -Unclear if her newer symptoms are related to the endoscopic findings  (gastritis, small duodenal erosion), are a manifestation of atypical reflux, or a new symptomatology altogether.  Discussed a very broad DDx, to include atypical reflux, nonulcer dyspepsia, SIBO.  Interestingly she reports feeling "her best" after a course of antibiotics in the fall 2021 for bacterial vaginosis.  Query SIBO and discussed breath testing versus empiric trial of rifaximin, and she prefers the latter.  - Trial Rifaximin and probiotic - Completing PPI course today - If no improvement, can plan for another course of PPI (Nexium) or discused role of pH/Mii vs Bravo placement vs trial FDGuard vs breath testing along with possible cross-sectional imaging -Alternatively, depending on symptomatology of follow-up, could also consider trial of Bentyl, Levsin, etc. - RTC in 3 months or sooner prn         Dominic Pea Kuuipo Anzaldo ,DO, FACG 02/20/2020, 11:09 AM

## 2020-04-21 ENCOUNTER — Ambulatory Visit: Payer: BC Managed Care – PPO | Admitting: Family Medicine

## 2020-04-21 ENCOUNTER — Encounter: Payer: Self-pay | Admitting: Family Medicine

## 2020-04-21 ENCOUNTER — Other Ambulatory Visit: Payer: Self-pay

## 2020-04-21 VITALS — BP 110/70 | HR 81 | Resp 16 | Ht 67.0 in | Wt 142.5 lb

## 2020-04-21 DIAGNOSIS — R7989 Other specified abnormal findings of blood chemistry: Secondary | ICD-10-CM | POA: Diagnosis not present

## 2020-04-21 DIAGNOSIS — E049 Nontoxic goiter, unspecified: Secondary | ICD-10-CM

## 2020-04-21 DIAGNOSIS — R5383 Other fatigue: Secondary | ICD-10-CM

## 2020-04-21 NOTE — Patient Instructions (Addendum)
A few things to remember from today's visit:   Elevated TSH - Plan: US THYROID, T3, free, T4, free, TSH  Enlarged thyroid gland - Plan: US THYROID  Fatigue, unspecified type  If you need refills please call your pharmacy. Do not use My Chart to request refills or for acute issues that need immediate attention.   We could try trial of thyroid med depending of lab results. Please be sure medication list is accurate. If a new problem present, please set up appointment sooner than planned today.

## 2020-04-21 NOTE — Progress Notes (Signed)
Chief Complaint  Patient presents with  . thyroid follow up   HPI: [name] is a pleasant 35 y.o. female, who is here today requesting thyroid panel done. She has had mildly abnormal TSH. Fatigue: Problem has been going on for about a year (adressed 09/07/19). Hx of depression and anxiety, she does not think this is a current problem. Negative for fever,chills,abnormal wt loss,or night sweats. Sleeping about 8-10 hours. Fatigue is better,not resolved.  Since her last visit she has seen GI. Better but still having GI issues. Epigastric discomfort sometimes.  EGD on 01/01/20:  - Normal esophagus. - Z-line regular, 38 cm from the incisors. - Gastroesophageal flap valve classified as Hill Grade II (fold present, opens with respiration). - Gastritis. Biopsied. - A few gastric polyps. Resected and retrieved. - Duodenal erosion without bleeding. Biopsied. - Normal first portion of the duodenum and second portion of the duodenum. Biopsied.  She completed abx and antimycotic treatment. When she took diflucan x 2 she felt "great" during the time she was taking medication. Next appt later this month.  Lab Results  Component Value Date   TSH 5.45 (H) 09/14/2019   Having bowel movement about 1-3 times per day.  Review of Systems  Constitutional: Negative for activity change, diaphoresis and fever.  HENT: Negative for mouth sores, sore throat and trouble swallowing.   Respiratory: Negative for cough, shortness of breath and wheezing.   Cardiovascular: Negative for chest pain, palpitations and leg swelling.  Gastrointestinal: Negative for nausea and vomiting.  Psychiatric/Behavioral: Negative for behavioral problems and confusion.  Rest see pertinent positives and negatives per HPI.  Current Outpatient Medications on File Prior to Visit  Medication Sig Dispense Refill  . B Complex Vitamins (VITAMIN B COMPLEX PO) Take 1 tablet by mouth daily.    . Cholecalciferol  (VITAMIN D PO) Take 800 Units by mouth daily.    . LO LOESTRIN FE 1 MG-10 MCG / 10 MCG tablet Take 1 tablet by mouth daily.    . Multiple Vitamins-Iron (MULTIVITAMIN/IRON PO) Take 1 tablet by mouth daily.    . pantoprazole (PROTONIX) 20 MG tablet Take 1 tablet (20 mg total) by mouth 2 (two) times daily. 30-60 minutes before food.  Best to take on an empty stomach. 90 tablet 0   No current facility-administered medications on file prior to visit.   Past Medical History:  Diagnosis Date  . Anemia    reports since highschool with remote eval - takes B12 and MV with iron  . Anxiety   . Depression   . Eating disorder    caloric restriction remotely - in college  . Frequent headaches   . History of stomach ulcers    no EGD, ? GERD rather then ulcer  . Migraines   . UTI (urinary tract infection)    Allergies  Allergen Reactions  . Sulfa Antibiotics Rash   Social History   Socioeconomic History  . Marital status: Married    Spouse name: Not on file  . Number of children: Not on file  . Years of education: Not on file  . Highest education level: Not on file  Occupational History  . Not on file  Tobacco Use  . Smoking status: Never Smoker  . Smokeless tobacco: Never Used  Vaping Use  . Vaping Use: Never used  Substance and Sexual Activity  . Alcohol use: Yes    Alcohol/week: 0.0 standard drinks    Comment: rarely  . Drug use: No  .  Sexual activity: Yes  Other Topics Concern  . 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  Physical Exam Vitals and nursing note reviewed.  Constitutional:      General: She is not in acute distress.    Appearance: She is well-developed.  HENT:     Head: Normocephalic and atraumatic.     Mouth/Throat:     Mouth: Oropharynx is clear and moist. Mucous membranes are dry.  Eyes:     Conjunctiva/sclera: Conjunctivae normal.  Neck:     Thyroid: Thyromegaly present.  Cardiovascular:     Rate and Rhythm: Normal rate and regular rhythm.     Pulses:          Dorsalis pedis pulses are 2+ on the right side and 2+ on the left side.     Heart sounds: No murmur heard.   Pulmonary:     Effort: Pulmonary effort is normal. No respiratory distress.     Breath sounds: Normal breath sounds.  Abdominal:     Palpations: Abdomen is soft. There is no hepatomegaly or mass.     Tenderness: There is no abdominal tenderness.  Musculoskeletal:        General: No edema.  Lymphadenopathy:     Cervical: No cervical adenopathy.  Skin:    General: Skin is warm.     Findings: No erythema or rash.  Neurological:     Mental Status: She is alert and oriented to person, place, and time.     Cranial Nerves: No cranial nerve deficit.     Gait: Gait normal.     Deep Tendon Reflexes: Strength normal.  Psychiatric:        Mood and Affect: Mood and affect normal.     Comments: Well groomed, good eye contact.    ASSESSMENT AND PLAN:  Virginia Robinson was seen today for thyroid follow up.  Diagnoses and all orders for this visit: Orders Placed This Encounter  Procedures  . US THYROID  . T3, free  . T4, free  . TSH   Lab Results  Component Value Date   TSH 1.01 04/21/2020   Fatigue, unspecified type We discussed possible etiologies: Systemic illness, immunologic,endocrinology,sleep disorder, psychiatric/psychologic, infectious,medications side effects, and idiopathic. Examination today does not suggest a  serious process. Healthy diet and regular physical activity may help.  Elevated TSH Possible etiologies discussed. I do not think this is causing fatigue, it was mild. We could consider trial of thyroid medication depending of thyroid panel results.  Enlarged thyroid gland ? Right thyroid nodule. Thyroid US will be arranged.  Return if symptoms worsen or fail to improve.   Kerington Hildebrant G. Martinique, MD  [facility]. [place] office.   A few things to remember from today's visit:   Elevated TSH - Plan: US THYROID, T3, free, T4,  free, TSH  Enlarged thyroid gland - Plan: US THYROID  Fatigue, unspecified type  If you need refills please call your pharmacy. Do not use My Chart to request refills or for acute issues that need immediate attention.   We could try trial of thyroid med depending of lab results. Please be sure medication list is accurate. If a new problem present, please set up appointment sooner than planned today.

## 2020-04-22 LAB — TSH: TSH: 1.01 u[IU]/mL (ref 0.35–4.50)

## 2020-04-22 LAB — T4, FREE: Free T4: 0.87 ng/dL (ref 0.60–1.60)

## 2020-04-22 LAB — T3, FREE: T3, Free: 3.2 pg/mL (ref 2.3–4.2)

## 2020-05-01 ENCOUNTER — Ambulatory Visit
Admission: RE | Admit: 2020-05-01 | Discharge: 2020-05-01 | Disposition: A | Discharge status: Home / Self Care | Payer: BC Managed Care – PPO | Source: Ambulatory Visit | Attending: Family Medicine | Admitting: Family Medicine

## 2020-05-01 DIAGNOSIS — R7989 Other specified abnormal findings of blood chemistry: Secondary | ICD-10-CM

## 2020-05-01 DIAGNOSIS — E049 Nontoxic goiter, unspecified: Secondary | ICD-10-CM

## 2020-05-08 ENCOUNTER — Ambulatory Visit: Payer: BC Managed Care – PPO | Admitting: Gastroenterology

## 2020-06-17 ENCOUNTER — Other Ambulatory Visit: Payer: Self-pay | Admitting: Certified Nurse Midwife

## 2020-06-17 DIAGNOSIS — N644 Mastodynia: Secondary | ICD-10-CM

## 2020-07-08 ENCOUNTER — Other Ambulatory Visit: Payer: Self-pay

## 2020-07-08 ENCOUNTER — Other Ambulatory Visit (HOSPITAL_COMMUNITY): Payer: BC Managed Care – PPO

## 2020-07-08 ENCOUNTER — Ambulatory Visit (INDEPENDENT_AMBULATORY_CARE_PROVIDER_SITE_OTHER): Payer: BC Managed Care – PPO | Admitting: Gastroenterology

## 2020-07-08 ENCOUNTER — Encounter: Payer: Self-pay | Admitting: Gastroenterology

## 2020-07-08 VITALS — BP 100/62 | HR 62 | Ht 67.0 in | Wt 139.2 lb

## 2020-07-08 DIAGNOSIS — R140 Abdominal distension (gaseous): Secondary | ICD-10-CM

## 2020-07-08 DIAGNOSIS — R12 Heartburn: Secondary | ICD-10-CM | POA: Diagnosis not present

## 2020-07-08 DIAGNOSIS — K219 Gastro-esophageal reflux disease without esophagitis: Secondary | ICD-10-CM

## 2020-07-08 DIAGNOSIS — R109 Unspecified abdominal pain: Secondary | ICD-10-CM

## 2020-07-08 MED ORDER — RIFAXIMIN 550 MG PO TABS: 550.0000 mg | ORAL_TABLET | Freq: Two times a day (BID) | ORAL | 0 refills | Status: AC

## 2020-07-08 NOTE — Patient Instructions (Addendum)
If you are age 37 or older, your body mass index should be between 23-30. Your Body mass index is 21.8 kg/m. If this is out of the aforementioned range listed, please consider follow up with your Primary Care Provider.  If you are age 100 or younger, your body mass index should be between 19-25. Your Body mass index is 21.8 kg/m. If this is out of the aformentioned range listed, please consider follow up with your Primary Care Provider.   You have been scheduled for an Esophageal Manometry and 24 Hour pH study at [facility] on 5/25/2022at 1030 am. Please arrive 30 minutes prior to your procedure for registration. You will need to go to outpatient registration (1st floor of the hospital) first. Make certain to bring your insurance cards as well as a complete list of medications.  Please remember the following:  1) Do not take any muscle relaxants, xanax (alprazolam) or ativan for 1 day prior to your test as well as the day of the test.  2) Nothing to eat or drink after 12:00 midnight on the night before your test.  3) Hold all diabetic medications/insulin the morning of the test. You may eat and take your medications after the test.  4) For 7 days prior to your test, do not take: Reglan, Tagamet, Zantac, Phenergan, Axid or Pepcid.  5) You MAY use an antacid such as Rolaids or Tums up to 12 hours prior to your test.  6. STOP ALL PPIs 7 days prior to test ------------------------------------------------------------------------------------------- ABOUT ESOPHAGEAL MANOMETRY Esophageal manometry (muh-NOM-uh-tree) is a test that gauges how well your esophagus works. Your esophagus is the long, muscular tube that connects your throat to your stomach. Esophageal manometry measures the rhythmic muscle contractions (peristalsis) that occur in your esophagus when you swallow. Esophageal manometry also measures the coordination and force exerted by the muscles of your esophagus.  During  esophageal manometry, a thin, flexible tube (catheter) that contains sensors is passed through your nose, down your esophagus and into your stomach. Esophageal manometry can be helpful in diagnosing some mostly uncommon disorders that affect your esophagus.  Why it's done Esophageal manometry is used to evaluate the movement (motility) of food through the esophagus and into the stomach. The test measures how well the circular bands of muscle (sphincters) at the top and bottom of your esophagus open and close, as well as the pressure, strength and pattern of the wave of esophageal muscle contractions that moves food along.  What you can expect Esophageal manometry is an outpatient procedure done without sedation. Most people tolerate it well. You may be asked to change into a hospital gown before the test starts.  During esophageal manometry  . While you are sitting up, a member of your health care team sprays your throat with a numbing medication or puts numbing gel in your nose or both.  . A catheter is guided through your nose into your esophagus. The catheter may be sheathed in a water-filled sleeve. It doesn't interfere with your breathing. However, your eyes may water, and you may gag. You may have a slight nosebleed from irritation.  . After the catheter is in place, you may be asked to lie on your back on an exam table, or you may be asked to remain seated.  . You then swallow small sips of water. As you do, a computer connected to the catheter records the pressure, strength and pattern of your esophageal muscle contractions.  . During the  test, you'll be asked to breathe slowly and smoothly, remain as still as possible, and swallow only when you're asked to do so.  . A member of your health care team may move the catheter down into your stomach while the catheter continues its measurements.  . The catheter then is slowly withdrawn.  This test typically takes 30-45 minutes to  complete.  ---------------------------------------------------------------------------------------------- ABOUT 24 HOUR PH PROBE An esophageal pH test measures and records the pH in your esophagus to determine if you have gastroesophageal reflux disease (GERD). The test can also be done to determine the effectiveness of medications or surgical treatment for GERD. What is esophageal reflux? Esophageal reflux is a condition in which stomach acid refluxes or moves back into the esophagus (the "food pipe" leading from the mouth to the stomach). How does the esophageal pH test work? A thin, small tube with an acid sensing device on the tip is gently passed through your nose, down the esophagus ("food tube"), and positioned about 2 inches above the lower esophageal sphincter. The tube is secured to the side of your face with clear tape. The end of the tube exiting from your nose is attached to a portable recorder that is worn on your belt or over your shoulder. The recorder has several buttons on it that you will press to mark certain events. A nurse will review the monitoring instructions with you. Once the test has begun, what do I need to know and do? Marland Kitchen Activity: Follow your usual daily routine. Do not reduce or change your activities during the monitoring period. Doing so can make the monitoring results less useful.  . Note: do not take a tub bath or shower; the equipment can't get wet.  . Eating: Eat your regular meals at the usual times. If you do not eat during the monitoring period, your stomach will not produce acid as usual, and the test results will not be accurate. Eat at least 2 meals a day. Eat foods that tend to increase your symptoms (without making yourself miserable). Avoid snacking. Do not suck on hard candy or lozenges and do not chew gum during the monitoring period.  . Lying down: Remain upright throughout the day. Do not lie down until you go to bed (unless napping or lying down during  the day is part of your daily routine).  . Medications: Continue to follow your doctor's advice regarding medications to avoid during the monitoring period.  . Recording symptoms: Press the appropriate button on your recorder when symptoms occur (as discussed with the nurse).  . Recording events: Record the time you start and stop eating and drinking (anything other than plain water). Record the time you lie down (even if just resting) and when you get back up. The nurse will explain this.  . Unusual symptoms or side effects. If you think you may be experiencing any unusual symptoms or side effects, call your doctor.  You will return the next day to have the tube removed. The information on the recorder will be downloaded to a computer and the results will be analyzed.  After completion of the study Resume your normal diet and medications. Lozenges or hard candy may help ease any sore throat caused by the tube.   It will take at least 2 weeks to receive the results of this test from your physician.   Due to recent COVID-19 restrictions implemented by our local and state authorities and in an effort to keep both patients and  staff as safe as possible, our hospital system now requires COVID-19 testing prior to any scheduled hospital procedure. Please go to [place], [place], [place] [address] on 07/18/2020 at 8:15am This is a drive up testing site, you will not need to exit your vehicle.  You will not be billed at the time of testing but may receive a bill later depending on your insurance. The approximate cost of the test is $100. You must agree to quarantine from the time of your testing until the procedure date on  . This should include staying at home with ONLY the people you live with. Avoid take-out, grocery store shopping or leaving the house for any non-emergent reason. Failure to have your COVID-19 test done on the date and time you have been scheduled will result in cancellation of  procedure. Please call our office at [phone] if you have any questions.

## 2020-07-08 NOTE — Progress Notes (Signed)
P  Chief Complaint:    GERD, sour brash  GI History: 35 year old female with a history of chest/lower sternal pressure. Symptoms started in June 2021; was seenin [facility] 09[facility] then[facility] on 09/05/2019,diagnosed with reflux, and started on Protonix. Only minimal improvementdespite using x4 weeks, so discontinued PPI. Pain slowly improved. Did have associated sour brashthat continues to occur intermittently. No radiation.Does report that symptoms started after a period of increased stress.  Then developed upper abdominal cramping, dyspepsia in September 2021.  Finally, also with change in stools since 08/2018 (after delivery of baby), described as loose stools without hematochezia or melena. Weight stable, good p.o. intake.  -09/05/2019: Normal BMP, CBC -09/07/2019: Normal B12, vitamin D, iron. TSH 5.54 -10/01/2019: Normal liver enzymes, negative H. pylori IgG -10/03/2019: Abdominal ultrasound: Normal -11/29/2019: Initial appointment in GI clinic -EGD (01/01/2020): Regular Z-line, Hill grade 2, mild gastritis, fundic gland polyps, small duodenal erosion, normal D2 (normal biopsies). Treated with 6-week course of Protonix -Colonoscopy (01/01/2020): Normal with normal TI. Biopsies negative for MC. Repeat 10 years - 01/2020: Trial course of rifaximin for possible SIBO with overall clinical improvement  HPI:     Patient is a 35 y.o. female presenting to the Gastroenterology Clinic for follow-up.  Last seen by me on 02/20/2020.  UGI symptoms "70% better" on PPI, but was starting to have gnawing discomfort in epigastrium/dyspepsia.  Was treated with course of rifaximin and probiotic for possible superimposed SIBO.  Reports she felt great for a few months.  Recently has been having an acidic taste in her mouth after completing rifaximin treatment in February. Can feel burning in mouth along with heartburn and regurgitation. Avoids coffee, acidic, HOB elevation, food diary, not taking probiotics  (helped), dietary mods, increased exercise.   She had nausea with protonix, so had to stop taking with resolution of nausea.  Aside from TSH (now normal), no new labs or GI imaging since last appointment.  Review of systems:     No chest pain, no SOB, no fevers, no urinary sx   Past Medical History:  Diagnosis Date  . Anemia    reports since highschool with remote eval - takes B12 and MV with iron  . Anxiety   . Depression   . Eating disorder    caloric restriction remotely - in college  . Frequent headaches   . History of stomach ulcers    no EGD, ? GERD rather then ulcer  . Migraines   . UTI (urinary tract infection)     Patient's surgical history, family medical history, social history, medications and allergies were all reviewed in Epic    Current Outpatient Medications  Medication Sig Dispense Refill  . B Complex Vitamins (VITAMIN B COMPLEX PO) Take 1 tablet by mouth daily.    . Cholecalciferol (VITAMIN D PO) Take 800 Units by mouth daily.    . LO LOESTRIN FE 1 MG-10 MCG / 10 MCG tablet Take 1 tablet by mouth daily.    . Multiple Vitamins-Iron (MULTIVITAMIN/IRON PO) Take 1 tablet by mouth daily.     No current facility-administered medications for this visit.    Physical Exam:     BP 100/62 (BP Location: Left Arm)   Pulse 62   Ht 5\' 7"  (1.702 m)   Wt 139 lb 3.2 oz (63.1 kg)   BMI 21.80 kg/m   GENERAL:  Pleasant female in NAD PSYCH: : Cooperative, normal affect NEURO: Alert and oriented x 3, no focal neurologic deficits   IMPRESSION and  PLAN:    1) GERD 2) Heartburn 3) Waterbrash Discussed pathophysiology of reflux at length today.  Prior EGD with Hill grade 2 valve.  Discussed further diagnostic and treatment options and plan for the following: - Esophageal Manometry and pH/impedance testing off PPI - If significant acid reflux, we discussed the role of antireflux surgery, specifically TIF  4) Abdominal cramping 5) Bloating - Felt better with a trial  of rifaximin, but symptoms have started to recur.  Discussed repeat course of rifaximin vs SIBO breath testing, and prefers the former - Repeat rifaximin along with a course of probiotics to extend 3 weeks beyond completion of rifaximin -Depending on response to therapy, could also consider trial of Bentyl, Levsin, etc. - RTC in 3 months or sooner prn  I spent 32 minutes of time, including in depth chart review, independent review of results as outlined above, communicating results with the patient directly, face-to-face time with the patient, coordinating care, and ordering studies and medications as appropriate, and documentation.          Lavena Bullion ,DO, FACG 07/08/2020, 2:18 PM

## 2020-07-14 ENCOUNTER — Telehealth: Payer: Self-pay | Admitting: Gastroenterology

## 2020-07-14 NOTE — Telephone Encounter (Signed)
Spoke with patient, she called to reschedule her esophageal manometry to Monday, 09/08/20 at 8:30 AM. Pre-screening COVID test no longer needed unless patient is symptomatic. Patient had no other concerns at the end of the call.

## 2020-07-16 ENCOUNTER — Telehealth: Payer: Self-pay | Admitting: Gastroenterology

## 2020-07-16 NOTE — Telephone Encounter (Signed)
Pulled the patient sheet for the day and it shows Dr Bryan Lemma orderd two times daily. The patient verbalized understanding. She also stated she has not heard from [facility] regarding her rx, told her I would call them and check the status and call her back

## 2020-07-16 NOTE — Telephone Encounter (Signed)
Pt called stating that she takes rifaximin 3 times a day. But she has it for 2 times a day. Please give her a call. Thank you

## 2020-07-16 NOTE — Telephone Encounter (Signed)
Called [employer] and spoke with Glennon Mac regarding the patients Virginia Robinson stated it has been approved  and he will reach out to the patient after we hang up to advise her.

## 2020-07-18 ENCOUNTER — Other Ambulatory Visit (HOSPITAL_COMMUNITY): Payer: BC Managed Care – PPO

## 2020-07-22 ENCOUNTER — Other Ambulatory Visit: Payer: BC Managed Care – PPO

## 2020-09-02 NOTE — Progress Notes (Signed)
Pt called to cancel mano with ph as no longer having symptoms.

## 2020-09-08 ENCOUNTER — Ambulatory Visit (HOSPITAL_COMMUNITY)
Admission: RE | Admit: 2020-09-08 | Payer: BC Managed Care – PPO | Source: Home / Self Care | Admitting: Gastroenterology

## 2020-09-08 ENCOUNTER — Encounter (HOSPITAL_COMMUNITY): Admission: RE | Payer: Self-pay | Source: Home / Self Care

## 2020-09-08 SURGERY — MANOMETRY, ESOPHAGUS

## 2020-09-17 ENCOUNTER — Ambulatory Visit: Payer: BC Managed Care – PPO | Admitting: Family Medicine

## 2020-09-17 ENCOUNTER — Encounter: Payer: Self-pay | Admitting: Family Medicine

## 2020-09-17 ENCOUNTER — Other Ambulatory Visit: Payer: Self-pay

## 2020-09-17 VITALS — BP 110/70 | HR 66 | Temp 98.0°F | Resp 16 | Ht 67.0 in | Wt 140.0 lb

## 2020-09-17 DIAGNOSIS — F321 Major depressive disorder, single episode, moderate: Secondary | ICD-10-CM

## 2020-09-17 DIAGNOSIS — F419 Anxiety disorder, unspecified: Secondary | ICD-10-CM

## 2020-09-17 MED ORDER — FLUOXETINE HCL 10 MG PO CAPS: 10.0000 mg | ORAL_CAPSULE | Freq: Every day | ORAL | 1 refills | Status: DC

## 2020-09-17 NOTE — Progress Notes (Signed)
Chief Complaint  Patient presents with   discuss mood supporting medication   HPI: Virginia Robinson is a 35 y.o. female, who is here today with above complaint.  She used to have anxiety attacks many years ago. Problem has been worse since 01/2020. She has been under some stress, no specific even that could have exacerbated symptoms, except for changes during COVID 19 in 2020. She has not been on medication before but she would like to discuss pharmacologic options.  Planning on starting CBT through work. FHx negative for bipolar disorder.  Sleeping about 6-8 hours. She has hx of abdominal pain and GI symptoms, has seen GI. Epigastric abdominal pain, heartburn,and mild nausea. Occasional loose stools.  She noted that symptoms resolved when she is on vacation or when taken time off from work.  Depression screen [facility] 2/9 09/17/2020 07/12/2017 12/06/2016  Decreased Interest 2 0 1  Down, Depressed, Hopeless 2 0 0  PHQ - 2 Score 4 0 1  Altered sleeping 2 0 1  Tired, decreased energy 3 1 2   Change in appetite 1 1 0  Feeling bad or failure about yourself  3 0 0  Trouble concentrating 1 0 0  Moving slowly or fidgety/restless 1 0 0  Suicidal thoughts 0 0 0  PHQ-9 Score 15 2 4   Difficult doing work/chores Somewhat difficult - -   GAD 7 : Generalized Anxiety Score 09/17/2020  Nervous, Anxious, on Edge 2  Control/stop worrying 2  Worry too much - different things 3  Trouble relaxing 3  Restless 1  Easily annoyed or irritable 3  Afraid - awful might happen 1  Total GAD 7 Score 15  Anxiety Difficulty Somewhat difficult   Mood disorder questionnaire negative  except for irritability toward people.  Review of Systems  Constitutional:  Positive for fatigue. Negative for activity change, appetite change and fever.  HENT:  Negative for mouth sores, nosebleeds and sore throat.   Respiratory:  Negative for cough, shortness of breath and wheezing.   Cardiovascular:  Negative for  chest pain, palpitations and leg swelling.  Gastrointestinal:  Negative for blood in stool and vomiting.       Negative for changes in bowel habits.  Endocrine: Negative for cold intolerance and heat intolerance.  Neurological:  Negative for syncope, weakness and headaches.  Psychiatric/Behavioral:  The patient is nervous/anxious.   Rest see pertinent positives and negatives per HPI.  Current Outpatient Medications on File Prior to Visit  Medication Sig Dispense Refill   B Complex Vitamins (VITAMIN B COMPLEX PO) Take 1 tablet by mouth daily.     Cholecalciferol (VITAMIN D PO) Take 800 Units by mouth daily.     Multiple Vitamins-Iron (MULTIVITAMIN/IRON PO) Take 1 tablet by mouth daily.     No current facility-administered medications on file prior to visit.   Past Medical History:  Diagnosis Date   Anemia    reports since highschool with remote eval - takes B12 and MV with iron   Anxiety    Depression    Eating disorder    caloric restriction remotely - in college   Frequent headaches    History of stomach ulcers    no EGD, ? GERD rather then ulcer   Migraines    UTI (urinary tract infection)    Allergies  Allergen Reactions   Sulfa Antibiotics Rash    Social History   Socioeconomic History   Marital status: Married    Spouse name: Not on file  Number of children: Not on file   Years of education: Not on file   Highest education level: Not on file  Occupational History   Not on file  Tobacco Use   Smoking status: Never   Smokeless tobacco: Never  Vaping Use   Vaping Use: Never used  Substance and Sexual Activity   Alcohol use: Yes    Alcohol/week: 0.0 standard drinks    Comment: rarely   Drug use: No   Sexual activity: Yes  Other Topics Concern   Not on file  Social History Narrative   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  Physical Exam Vitals and nursing note reviewed.  Constitutional:      General: She is not in acute distress.    Appearance: She is well-developed and normal weight.  HENT:     Head: Normocephalic and atraumatic.  Eyes:     Conjunctiva/sclera: Conjunctivae normal.  Cardiovascular:     Rate and Rhythm: Normal rate and regular rhythm.     Heart sounds: No murmur heard. Pulmonary:     Effort: Pulmonary effort is normal. No respiratory distress.     Breath sounds: Normal breath sounds.  Abdominal:     Palpations: Abdomen is soft. There is no mass.     Tenderness: There is no abdominal tenderness.  Skin:    General: Skin is warm.     Findings: No erythema or rash.  Neurological:     General: No focal deficit present.     Mental Status: She is alert and oriented to person, place, and time.     Gait: Gait normal.  Psychiatric:        Thought Content: Thought content does not include suicidal ideation. Thought content does not include suicidal plan.     Comments: Well groom, good eye contact.   ASSESSMENT AND PLAN:  Ms.Araiya was seen today for discuss mood supporting medication.  Diagnoses and all orders for this visit:  Anxiety disorder, unspecified type After discussion of pharmacologic options and some side effects, including GI.  She agrees with trying Fluoxetine 10 mg daily. Fluoxetine dose can be increased in 2-3 weeks if well tolerated.  Planning CBT to start.  -     FLUoxetine (PROZAC) 10 MG capsule; Take 1 capsule (10 mg total) by mouth daily.  Depression, major,  single episode, moderate ([place]) We discussed Dx,prognosis,and treatment options. Fluoxetine 10 mg started today. CBT may also help. Instructed about warning signs.  -     FLUoxetine (PROZAC) 10 MG capsule; Take 1 capsule (10 mg total) by mouth daily.  Return in about 2 months (around 11/18/2020).   Makenlee Mckeag G. Martinique, MD  [facility]. [place] office.   A few things to remember from today's visit:   Anxiety disorder, unspecified type - Plan: FLUoxetine (PROZAC) 10 MG  capsule  If you need refills please call your pharmacy. Do not use My Chart to request refills or for acute issues that need immediate attention.   Today we started Fluoxetine, this type of medications can increase suicidal risk. This is more prevalent among children,adolecents, and young adults with major depression or other psychiatric disorders. It can also make depression worse. Most common side effects are gastrointestinal, self limited after a few weeks: diarrhea, nausea, constipation  Or diarrhea among some. Start with 10 mg of Fluoxetine and in 2 weeks you can increase dose to 20 mg. Please let me know in 3-4 weeks how you are tolerating medication.  In general it is well tolerated. We will follow closely.  Please be sure medication list is accurate. If a new problem present, please set up appointment sooner than planned today.

## 2020-09-17 NOTE — Patient Instructions (Signed)
A few things to remember from today's visit:   Anxiety disorder, unspecified type - Plan: FLUoxetine (PROZAC) 10 MG capsule  If you need refills please call your pharmacy. Do not use My Chart to request refills or for acute issues that need immediate attention.   Today we started Fluoxetine, this type of medications can increase suicidal risk. This is more prevalent among children,adolecents, and young adults with major depression or other psychiatric disorders. It can also make depression worse. Most common side effects are gastrointestinal, self limited after a few weeks: diarrhea, nausea, constipation  Or diarrhea among some. Start with 10 mg of Fluoxetine and in 2 weeks you can increase dose to 20 mg. Please let me know in 3-4 weeks how you are tolerating medication.  In general it is well tolerated. We will follow closely.  Please be sure medication list is accurate. If a new problem present, please set up appointment sooner than planned today.

## 2020-10-06 ENCOUNTER — Encounter: Payer: Self-pay | Admitting: Family Medicine

## 2020-10-08 ENCOUNTER — Other Ambulatory Visit: Payer: Self-pay | Admitting: Family Medicine

## 2020-10-08 DIAGNOSIS — F419 Anxiety disorder, unspecified: Secondary | ICD-10-CM

## 2020-10-08 DIAGNOSIS — F321 Major depressive disorder, single episode, moderate: Secondary | ICD-10-CM

## 2020-10-08 MED ORDER — FLUOXETINE HCL 20 MG PO CAPS
20.0000 mg | ORAL_CAPSULE | Freq: Every day | ORAL | 0 refills | Status: DC
Start: 2020-10-08 — End: 2021-01-06

## 2021-01-06 ENCOUNTER — Other Ambulatory Visit: Payer: Self-pay | Admitting: Family Medicine

## 2021-01-06 DIAGNOSIS — F419 Anxiety disorder, unspecified: Secondary | ICD-10-CM

## 2021-01-06 DIAGNOSIS — F321 Major depressive disorder, single episode, moderate: Secondary | ICD-10-CM

## 2021-03-13 ENCOUNTER — Ambulatory Visit: Payer: BC Managed Care – PPO | Admitting: Family Medicine

## 2021-03-20 NOTE — Progress Notes (Signed)
ACUTE VISIT Chief Complaint  Patient presents with   pain in upper left chest below ribs    Off & on for a few months, unsure if muscle, stomach or another organ.    HPI: [name] is a 36 y.o. female with hx of anxiety,vit D def, iron def anemia,and migraines here today complaining of at least 6-7 months of left lower chest/ rib cage. Pain is intermittent,last about 2-3 weeks then resolves. She has not identified alleviating factors. It usually happens at night.  Chest Pain  This is a recurrent problem. The current episode started more than 1 month ago. The problem has been waxing and waning. The pain is at a severity of 6/10. The pain is moderate. The quality of the pain is described as dull. The pain does not radiate. Pertinent negatives include no abdominal pain, back pain, claudication, cough, diaphoresis, dizziness, exertional chest pressure, fever, hemoptysis, irregular heartbeat, leg pain, lower extremity edema, malaise/fatigue, nausea, near-syncope, numbness, orthopnea, palpitations, PND, shortness of breath, syncope, vomiting or weakness. The pain is aggravated by nothing. She has tried nothing for the symptoms.  Sometimes it is very uncomfortable, cannot sleep on left side. It seems to be stable.  Occasional heartburn. She thought is may be GERD, she takes Omeprazole 20 mg daily and has taken Tums when she has pain but does not help.  -"Shaking" like episodes on and off for about 3-4 months, she thinks it is blood sugar. This is not related with above problem. It lasts a couple minutes. She has not identified exacerbating or alleviating factors. No hx of diabetes. No associated MS changes or urine/bowel incontinence. She does not think it is anxiety because it has been very well controlled with Prozac.  Subclinical hypothyroidism: She has not noted changes in bowel habits or abnormal wt loss.  Lab Results  Component Value Date   TSH 1.01 04/21/2020    Review of Systems  Constitutional:  Negative for diaphoresis, fever and malaise/fatigue.  HENT:  Negative for sore throat and trouble swallowing.   Respiratory:  Negative for cough, hemoptysis, shortness of breath and wheezing.   Cardiovascular:  Negative for palpitations, orthopnea, claudication, leg swelling, syncope, PND and near-syncope.  Gastrointestinal:  Negative for abdominal pain, nausea and vomiting.  Endocrine: Negative for cold intolerance, heat intolerance, polydipsia, polyphagia and polyuria.  Musculoskeletal:  Negative for back pain, gait problem and neck pain.  Neurological:  Negative for dizziness, weakness and numbness.  Psychiatric/Behavioral:  Positive for sleep disturbance. Negative for confusion. The patient is not nervous/anxious.   Rest see pertinent positives and negatives per HPI.  Current Outpatient Medications on File Prior to Visit  Medication Sig Dispense Refill   B Complex Vitamins (VITAMIN B COMPLEX PO) Take 1 tablet by mouth daily.     Cholecalciferol (VITAMIN D PO) Take 800 Units by mouth daily.     FLUoxetine (PROZAC) 20 MG capsule TAKE 1 CAPSULE BY MOUTH EVERY DAY 90 capsule 1   Multiple Vitamins-Iron (MULTIVITAMIN/IRON PO) Take 1 tablet by mouth daily.     No current facility-administered medications on file prior to visit.   Past Medical History:  Diagnosis Date   Anemia    reports since highschool with remote eval - takes B12 and MV with iron   Anxiety    Depression    Eating disorder    caloric restriction remotely - in college   Frequent headaches    History of stomach ulcers    no EGD, ?  GERD rather then ulcer   Migraines    UTI (urinary tract infection)    Allergies  Allergen Reactions   Sulfa Antibiotics Rash   Social History   Socioeconomic History   Marital status: Married    Spouse name: Not on file   Number of children: Not on file   Years of education: Not on file   Highest education level: Not on file  Occupational  History   Not on file  Tobacco Use   Smoking status: Never   Smokeless tobacco: Never  Vaping Use   Vaping Use: Never used  Substance and Sexual Activity   Alcohol use: Yes    Alcohol/week: 0.0 standard drinks    Comment: rarely   Drug use: No   Sexual activity: Yes  Other Topics Concern   Not on file  Social History Narrative   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  Physical Exam Vitals and nursing note reviewed.  Constitutional:      General: She is not in acute distress.    Appearance: She is well-developed and normal weight.  HENT:     Head: Normocephalic and atraumatic.     Mouth/Throat:     Mouth: Mucous membranes are moist.     Pharynx: Oropharynx is clear.  Eyes:     Conjunctiva/sclera: Conjunctivae normal.  Cardiovascular:     Rate and Rhythm: Normal rate and regular rhythm.     Heart sounds: No murmur heard. Pulmonary:     Effort: Pulmonary effort is normal. No respiratory distress.     Breath sounds: Normal breath sounds.  Chest:    Abdominal:     Palpations: Abdomen is soft. There is no hepatomegaly or mass.     Tenderness: There is no abdominal tenderness.  Lymphadenopathy:     Cervical: No cervical adenopathy.     Upper Body:     Right upper body: No supraclavicular adenopathy.     Left upper body: No supraclavicular adenopathy.  Skin:    General: Skin is warm.     Findings: No erythema or rash.  Neurological:     General: No focal deficit  present.     Mental Status: She is alert and oriented to person, place, and time.     Cranial Nerves: No cranial nerve deficit.     Gait: Gait normal.  Psychiatric:     Comments: Well groomed, good eye contact.   ASSESSMENT AND PLAN:  [name] was seen today for pain in upper left chest below ribs.  Diagnoses and all orders for this visit: Orders Placed This Encounter  Procedures   DG Ribs Unilateral Left   CBC   Comprehensive metabolic panel   TSH   Lab Results  Component Value Date   TSH 0.75 03/23/2021   Lab Results  Component Value Date   WBC 5.2 03/23/2021  HGB 14.2 03/23/2021   HCT 43.0 03/23/2021   MCV 89.3 03/23/2021   PLT 174.0 03/23/2021   Lab Results  Component Value Date   CREATININE 0.66 03/23/2021   BUN 9 03/23/2021   NA 141 03/23/2021   K 3.6 03/23/2021   CL 104 03/23/2021   CO2 29 03/23/2021   Lab Results  Component Value Date   ALT 12 03/23/2021   AST 14 03/23/2021   ALKPHOS 41 03/23/2021   BILITOT 0.4 03/23/2021   Costal margin pain We discussed possible causes. It seems musculoskeletal.  Rib X ray ordered today. Meloxicam 15 mg daily for 7-10 days. We could consider [place] or CT if problem does not resolved.  Episode of shaking We discussed possible etiologies. Hx and examination do not suggest a serious process. Avoid skipping meals. Further recommendations according to lab results.  Subclinical hypothyroidism Last TSH was in normal range. Further recommendations according to TSH result.  Return if symptoms worsen or fail to improve.  Shearon Clonch G. [place], MD  [facility]. [place] office.

## 2021-03-23 ENCOUNTER — Ambulatory Visit (INDEPENDENT_AMBULATORY_CARE_PROVIDER_SITE_OTHER): Payer: BC Managed Care – PPO

## 2021-03-23 ENCOUNTER — Ambulatory Visit: Payer: BC Managed Care – PPO | Admitting: Family Medicine

## 2021-03-23 ENCOUNTER — Encounter: Payer: Self-pay | Admitting: Family Medicine

## 2021-03-23 ENCOUNTER — Other Ambulatory Visit: Payer: Self-pay

## 2021-03-23 VITALS — BP 120/70 | HR 76 | Resp 16 | Ht 67.0 in | Wt 154.5 lb

## 2021-03-23 DIAGNOSIS — E038 Other specified hypothyroidism: Secondary | ICD-10-CM

## 2021-03-23 DIAGNOSIS — R0781 Pleurodynia: Secondary | ICD-10-CM | POA: Diagnosis not present

## 2021-03-23 DIAGNOSIS — R251 Tremor, unspecified: Secondary | ICD-10-CM

## 2021-03-23 LAB — CBC
HCT: 43 % (ref 36.0–46.0)
Hemoglobin: 14.2 g/dL (ref 12.0–15.0)
MCHC: 33 g/dL (ref 30.0–36.0)
MCV: 89.3 fl (ref 78.0–100.0)
Platelets: 174 10*3/uL (ref 150.0–400.0)
RBC: 4.81 Mil/uL (ref 3.87–5.11)
RDW: 12.7 % (ref 11.5–15.5)
WBC: 5.2 10*3/uL (ref 4.0–10.5)

## 2021-03-23 LAB — COMPREHENSIVE METABOLIC PANEL
ALT: 12 U/L (ref 0–35)
AST: 14 U/L (ref 0–37)
Albumin: 4.6 g/dL (ref 3.5–5.2)
Alkaline Phosphatase: 41 U/L (ref 39–117)
BUN: 9 mg/dL (ref 6–23)
CO2: 29 mEq/L (ref 19–32)
Calcium: 9.1 mg/dL (ref 8.4–10.5)
Chloride: 104 mEq/L (ref 96–112)
Creatinine, Ser: 0.66 mg/dL (ref 0.40–1.20)
GFR: 112.3 mL/min (ref >60.00)
Glucose, Bld: 70 mg/dL (ref 70–99)
Potassium: 3.6 mEq/L (ref 3.5–5.1)
Sodium: 141 mEq/L (ref 135–145)
Total Bilirubin: 0.4 mg/dL (ref 0.2–1.2)
Total Protein: 7.1 g/dL (ref 6.0–8.3)

## 2021-03-23 LAB — TSH: TSH: 0.75 u[IU]/mL (ref 0.35–5.50)

## 2021-03-23 MED ORDER — MELOXICAM 15 MG PO TABS: 15.0000 mg | ORAL_TABLET | Freq: Every day | ORAL | 0 refills | Status: AC

## 2021-03-23 NOTE — Patient Instructions (Addendum)
A few things to remember from today's visit:   Costal margin pain - Plan: DG Ribs Unilateral Left, CBC, Comprehensive metabolic panel  Episode of shaking - Plan: Comprehensive metabolic panel  Subclinical hypothyroidism - Plan: TSH  If you need refills please call your pharmacy. Do not use My Chart to request refills or for acute issues that need immediate attention.   Rib cage pain seems to be musculoskeletal. Meloxicam 15 mg (instead Celebrex because allergic to sulfas)for 10 days and then as needed. We can consider ultrasound.   Please be sure medication list is accurate. If a new problem present, please set up appointment sooner than planned today.

## 2021-06-12 ENCOUNTER — Other Ambulatory Visit: Payer: Self-pay | Admitting: Certified Nurse Midwife

## 2021-06-12 DIAGNOSIS — N644 Mastodynia: Secondary | ICD-10-CM

## 2021-10-23 ENCOUNTER — Ambulatory Visit: Payer: BC Managed Care – PPO | Admitting: Family Medicine

## 2022-05-24 NOTE — Progress Notes (Unsigned)
HPI: Virginia Robinson is a 37 y.o. female with past medical history significant for anxiety, subclinical hypothyroidism, and vitamin D deficiency here today for chronic disease management.  Last seen on 03/23/21 for acute visit. Anxiety and depression last followed on 09/17/2020. She is no longer on fluoxetine 20 mg.  She reports having discontinued Prozac in May 2023, attributing the cessation to an improvement in her condition at that time. However, she notes a deterioration in her anxiety levels since October/2023, which she associates with stressors at her workplace, [employer]. She characterizes her anxiety and depression as situational, work related. Her previous experience with Prozac at a dosage of 20 mg was positive, aiding in the management of her anxiety without adverse effects. She mentions that by the end of her last course of treatment, she was taking the medication on an alternate-day basis. She is sleeping an average of 8-10 hours per night, still feeling fatigue.  She denies any symptoms of chest pain, palpitations, SOB, changes in bowel habits, or tremors.  Lab Results  Component Value Date   TSH 0.75 03/23/2021   Lab Results  Component Value Date   WBC 5.2 03/23/2021   HGB 14.2 03/23/2021   HCT 43.0 03/23/2021   MCV 89.3 03/23/2021   PLT 174.0 03/23/2021   She has had CBT in the past and it is provided through her employer.      05/25/2022    8:37 AM 03/23/2021    9:02 AM 09/21/2020   12:38 PM 07/12/2017    4:21 PM 12/06/2016    3:19 PM  Depression screen PHQ 2/9  Decreased Interest 1 0 2 0 1  Down, Depressed, Hopeless 1 0 2 0 0  PHQ - 2 Score 2 0 4 0 1  Altered sleeping 1 1 2  0 1  Tired, decreased energy 2 1 3 1 2   Change in appetite 1 0 1 1 0  Feeling bad or failure about yourself  2 0 3 0 0  Trouble concentrating 0 0 1 0 0  Moving slowly or fidgety/restless 2 0 1 0 0  Suicidal thoughts 0 0 0 0 0  PHQ-9 Score 10 2 15 2 4   Difficult doing  work/chores Somewhat difficult Not difficult at all Somewhat difficult        05/25/2022    8:37 AM 09/21/2020   12:38 PM  GAD 7 : Generalized Anxiety Score  Nervous, Anxious, on Edge 1 2  Control/stop worrying 1 2  Worry too much - different things 2 3  Trouble relaxing 2 3  Restless 0 1  Easily annoyed or irritable 2 3  Afraid - awful might happen 0 1  Total GAD 7 Score 8 15  Anxiety Difficulty Somewhat difficult Somewhat difficult   Review of Systems  Constitutional:  Negative for activity change, chills and fever.  Cardiovascular:  Negative for chest pain and leg swelling.  Gastrointestinal:  Negative for abdominal pain, nausea and vomiting.  Endocrine: Negative for cold intolerance and heat intolerance.  Musculoskeletal:  Negative for gait problem and myalgias.  Neurological:  Negative for syncope and headaches.  Psychiatric/Behavioral:  Negative for confusion and hallucinations.   See other pertinent positives and negatives in HPI.  No current outpatient medications on file prior to visit.   No current facility-administered medications on file prior to visit.   Past Medical History:  Diagnosis Date   Anemia    reports since highschool with remote eval - takes B12 and MV with  iron   Anxiety    Depression    Eating disorder    caloric restriction remotely - in college   Frequent headaches    History of stomach ulcers    no EGD, ? GERD rather then ulcer   Migraines    UTI (urinary tract infection)    Allergies  Allergen Reactions   Sulfa Antibiotics Rash    Social History   Socioeconomic History   Marital status: Married    Spouse name: Not on file   Number of children: Not on file   Years of education: Not on file   Highest education level: Not on file  Occupational History   Not on file  Tobacco Use   Smoking status: Never   Smokeless tobacco: Never  Vaping Use   Vaping Use: Never used  Substance and Sexual Activity   Alcohol use: Yes     Alcohol/week: 0.0 standard drinks of alcohol    Comment: rarely   Drug use: No   Sexual activity: Yes  Other Topics Concern   Not on file  Social History Narrative   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 Physical Exam Vitals and nursing note reviewed.  Constitutional:      General: She is not in acute distress.    Appearance: She is well-developed.  HENT:     Head: Normocephalic and atraumatic.  Eyes:     Conjunctiva/sclera: Conjunctivae normal.  Neck:     Thyroid: No thyroid mass.  Cardiovascular:     Rate and Rhythm: Normal rate and regular rhythm.     Heart sounds: No murmur heard. Pulmonary:     Effort: Pulmonary effort is normal. No respiratory distress.     Breath sounds: Normal breath sounds.  Lymphadenopathy:     Cervical: No cervical adenopathy.  Skin:    General: Skin is warm.     Findings: No erythema or rash.  Neurological:     General: No focal deficit present.     Mental Status: She is alert and oriented to person, place, and time.     Gait: Gait normal.  Psychiatric:        Mood and Affect: Affect normal. Mood is anxious.        Thought Content: Thought content does not include homicidal or suicidal ideation. Thought content does not include homicidal or suicidal plan.   ASSESSMENT AND PLAN:  [name] was seen today for follow-up.  Diagnoses and all orders for this visit:  GAD (generalized anxiety disorder) Assessment &  Plan: She feels like Fluoxetine was helping and would like to resume it. Start Fluoxetine 10 mg x 14 days then increase to 20 mg daily. She has done well with this medication at the same dose , so if not new symptoms present and she notices improvement with no side effects, I  think it is appropriate to follow annually. She will let me know how she is doing after taking medication for about 2 months. She has CBT offered by employer that she can use if needed. Instructed about warning signs.  Orders: -     FLUoxetine HCl; Take 1 capsule (10 mg total) by mouth daily for 14 days.  Dispense: 14 capsule; Refill: 0 -     FLUoxetine HCl; TAKE 1 CAPSULE BY MOUTH EVERY DAY  Dispense: 90 capsule; Refill: 3  Depression, major, single episode, moderate ([facility]) Assessment & Plan: Fluoxetine resume today. She has medication well in the past. Instructed about warning signs.  Orders: -     FLUoxetine HCl; Take 1 capsule (10 mg total) by mouth daily for 14 days.  Dispense: 14 capsule; Refill: 0 -     FLUoxetine HCl; TAKE 1 CAPSULE BY MOUTH EVERY DAY  Dispense: 90 capsule; Refill: 3  We decided to hold on labs for now, we will do so next visit.  Return in about 1 year (around 05/25/2023) for CPE, chronic problems.  Monte Zinni G. Martinique, MD  [facility]. [place] office.

## 2022-05-25 ENCOUNTER — Encounter: Payer: Self-pay | Admitting: Family Medicine

## 2022-05-25 ENCOUNTER — Ambulatory Visit (INDEPENDENT_AMBULATORY_CARE_PROVIDER_SITE_OTHER): Payer: BC Managed Care – PPO | Admitting: Family Medicine

## 2022-05-25 VITALS — BP 118/70 | HR 70 | Temp 98.6°F | Resp 12 | Ht 67.0 in | Wt 161.5 lb

## 2022-05-25 DIAGNOSIS — F411 Generalized anxiety disorder: Secondary | ICD-10-CM

## 2022-05-25 DIAGNOSIS — F419 Anxiety disorder, unspecified: Secondary | ICD-10-CM

## 2022-05-25 DIAGNOSIS — F321 Major depressive disorder, single episode, moderate: Secondary | ICD-10-CM

## 2022-05-25 MED ORDER — FLUOXETINE HCL 20 MG PO CAPS: ORAL_CAPSULE | ORAL | 3 refills | Status: DC

## 2022-05-25 MED ORDER — FLUOXETINE HCL 10 MG PO CAPS: 10.0000 mg | ORAL_CAPSULE | Freq: Every day | ORAL | 0 refills | Status: DC

## 2022-05-25 NOTE — Assessment & Plan Note (Signed)
She feels like Fluoxetine was helping and would like to resume it. Start Fluoxetine 10 mg x 14 days then increase to 20 mg daily. She has done well with this medication at the same dose , so if not new symptoms present and she notices improvement with no side effects, I think it is appropriate to follow annually. She will let me know how she is doing after taking medication for about 2 months. She has CBT offered by employer that she can use if needed. Instructed about warning signs.

## 2022-05-25 NOTE — Assessment & Plan Note (Signed)
Fluoxetine resume today. She has medication well in the past. Instructed about warning signs.

## 2022-05-25 NOTE — Patient Instructions (Signed)
A few things to remember from today's visit:  Anxiety disorder, unspecified type  Depression, major, single episode, moderate ([place])  If you need refills for medications you take chronically, please call your pharmacy. Do not use My Chart to request refills or for acute issues that need immediate attention. If you send a my chart message, it may take a few days to be addressed, specially if I am not in the office.  Please be sure medication list is accurate. If a new problem present, please set up appointment sooner than planned today.

## 2022-07-24 IMAGING — US US THYROID
1 series · 14 of 25 positions shown · non-contrast
Comparison: None.

CLINICAL DATA: Enlarged right thyroid on physical exam. Elevated
TSH.

EXAM:
THYROID ULTRASOUND
TECHNIQUE: Ultrasound examination of the thyroid gland and adjacent soft
tissues was performed.

[Series 1: us thyroid · 0.08mm/px · 14 of 36 slices shown]
[im 1/36]
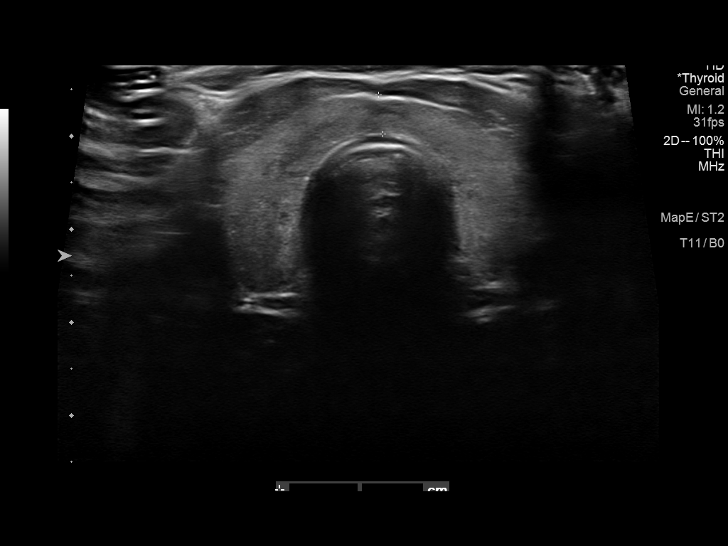
[im 3/36]
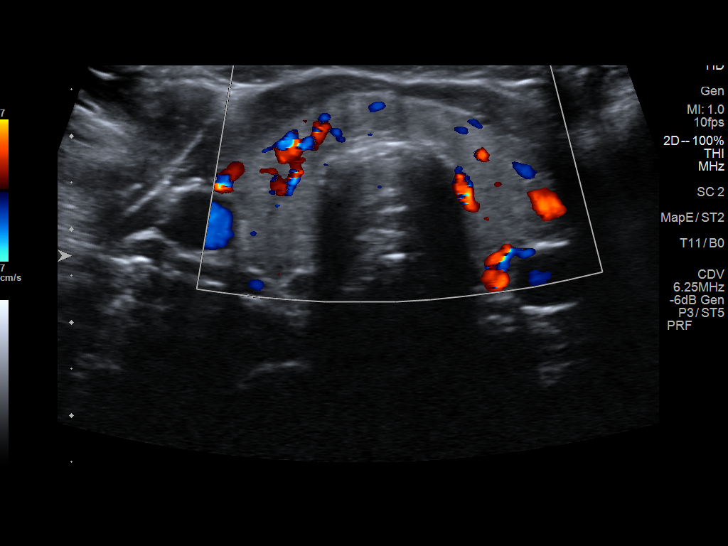
[im 6/36]
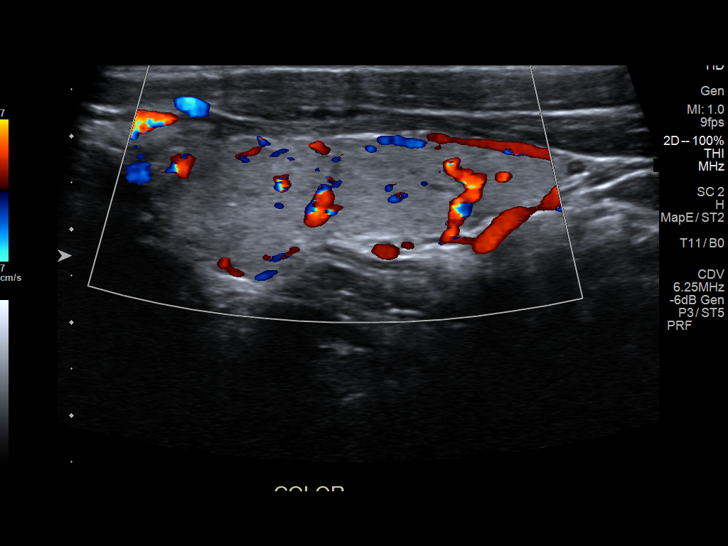
[im 9/36]
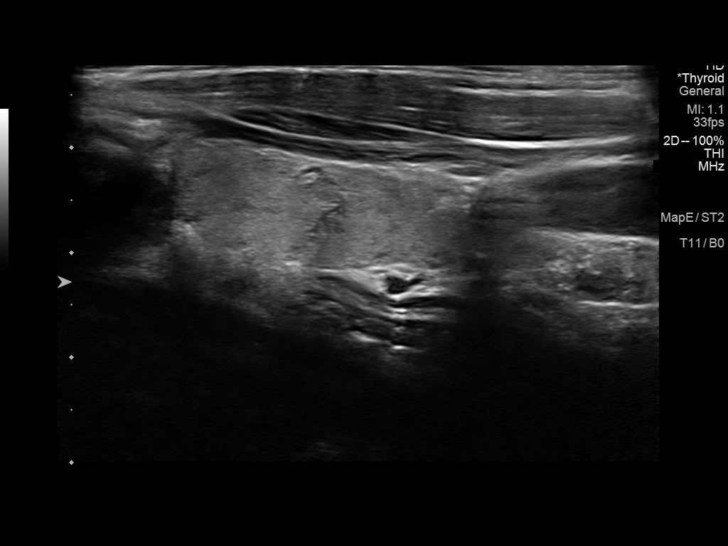
[im 12/36]
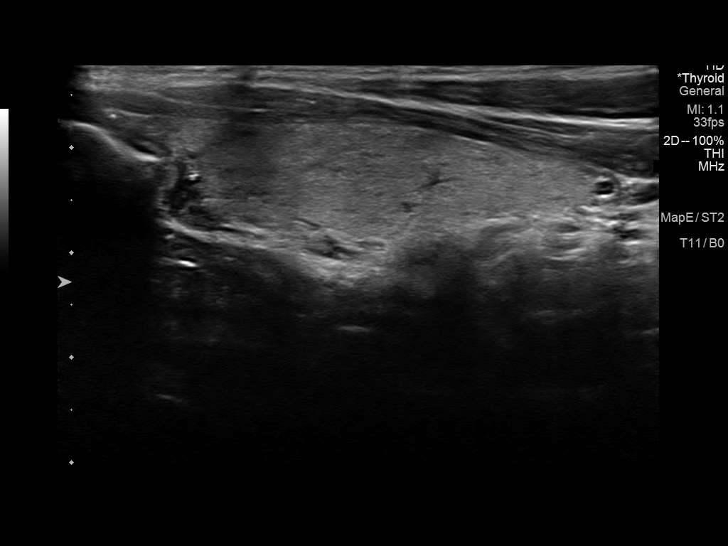
[im 14/36]
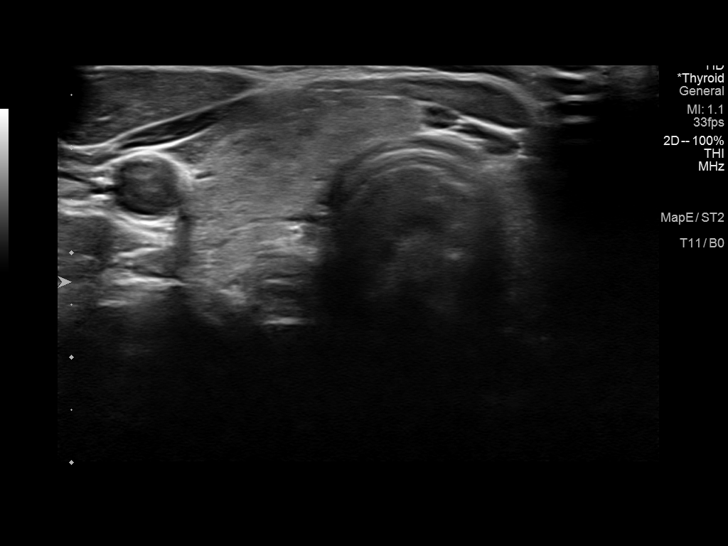
[im 17/36]
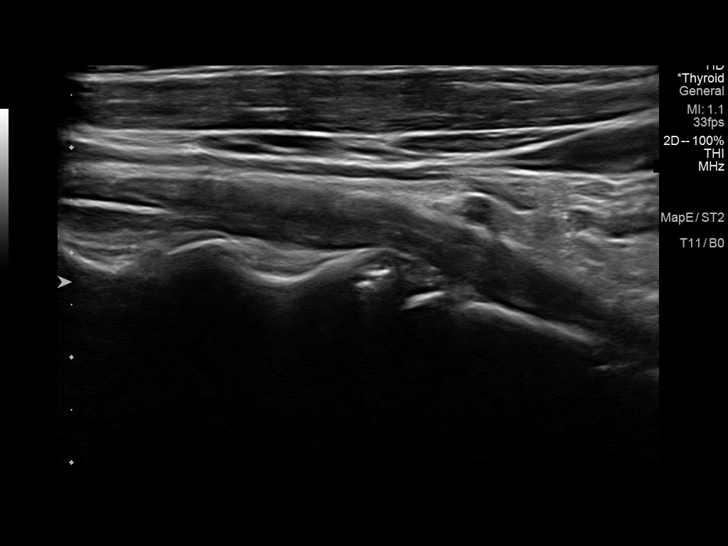
[im 19/36]
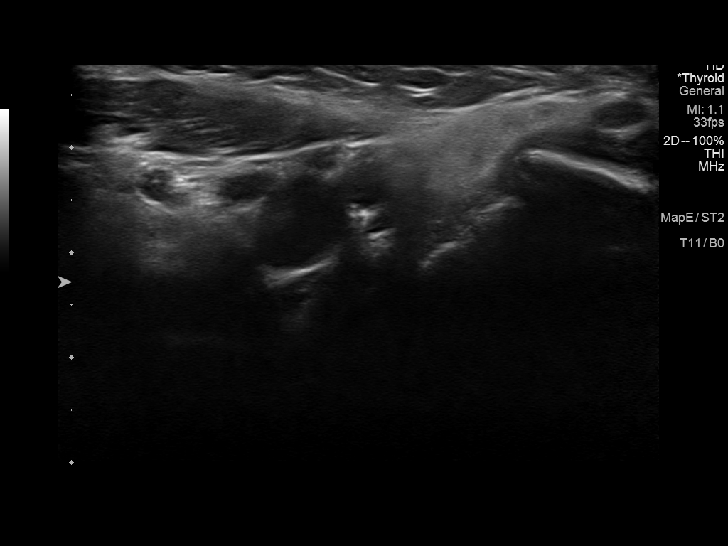
[im 22/36]
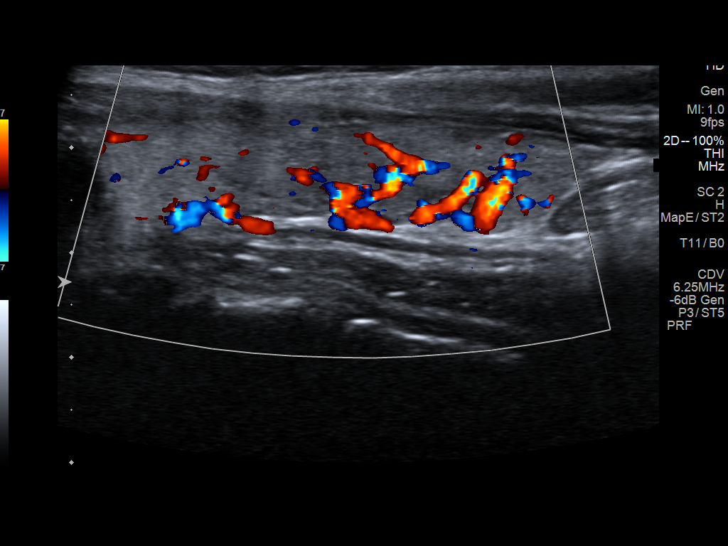
[im 24/36]
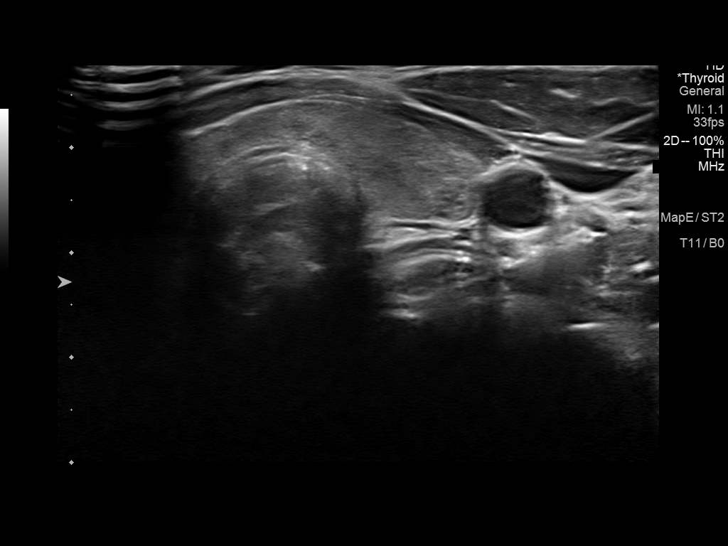
[im 27/36]
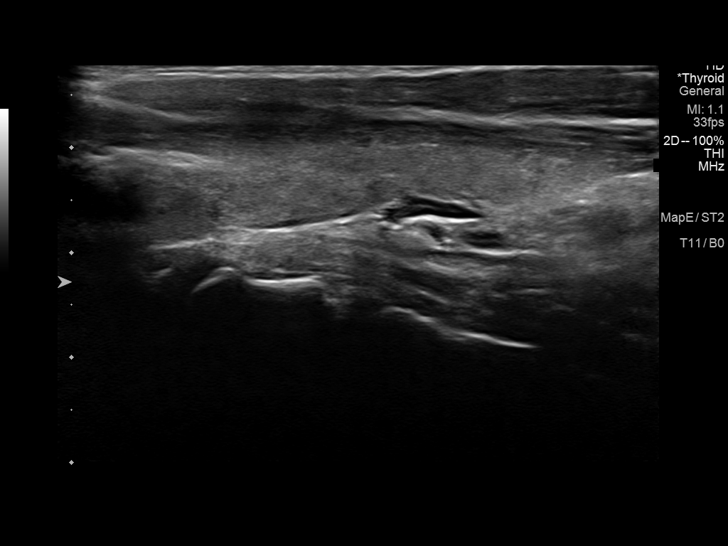
[im 30/36]
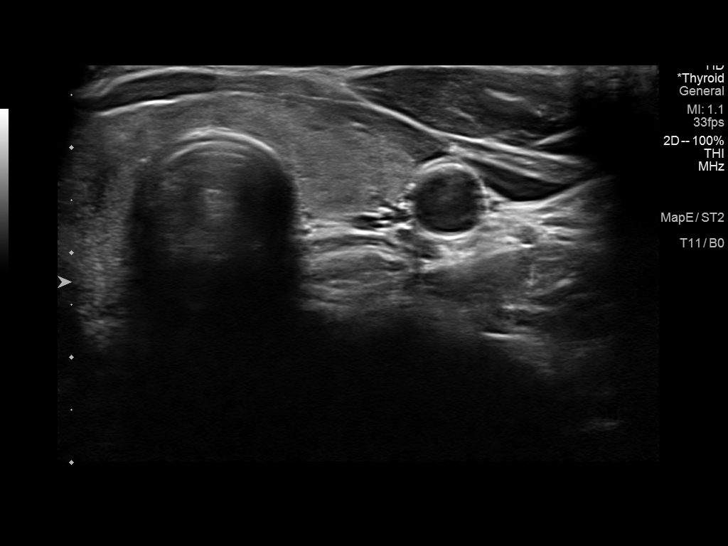
[im 33/36]
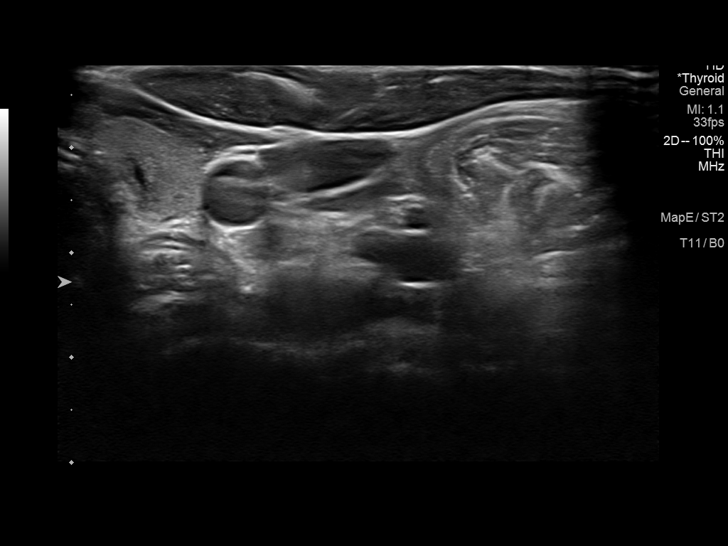
[im 36/36]
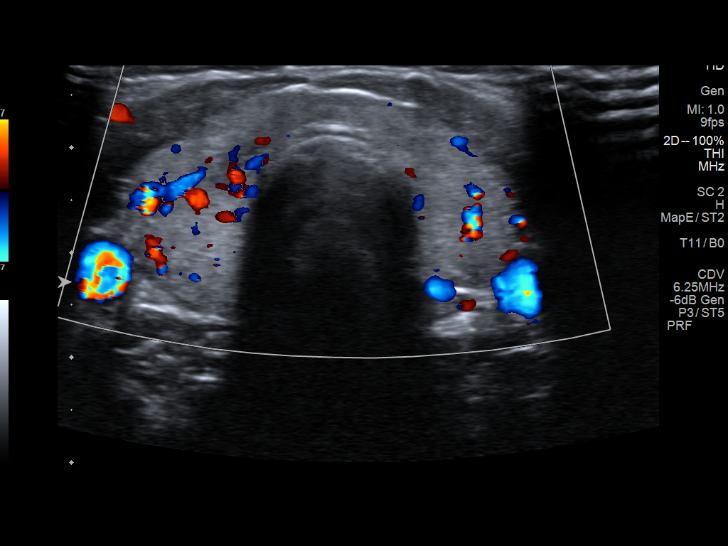

[14 of 25 positions shown; findings below may reference images not displayed]

FINDINGS: Parenchymal Echotexture: Mildly heterogeneous

Isthmus: This 0.4 cm

Right lobe: 4.5 x 1.3 x 1.3 cm

Left lobe: 5.0 x 0.9 x 1.2 cm

_________________________________________________________

Estimated total number of nodules >/= 1 cm: 0

Number of spongiform nodules >/=  2 cm not described below (TR1): 0

Number of mixed cystic and solid nodules >/= 1.5 cm not described
below (TR2): 0

_________________________________________________________

No discrete nodules are seen within the thyroid gland.
IMPRESSION: No thyroid nodules.

The above is in keeping with the ACR TI-RADS recommendations - [HOSPITAL] 8005;[DATE].

## 2022-08-06 ENCOUNTER — Encounter: Payer: Self-pay | Admitting: Family Medicine

## 2022-08-24 NOTE — Progress Notes (Signed)
ACUTE VISIT Chief Complaint  Patient presents with   stomach issues    Having vomiting every few months, similar to food poisoning.    HPI: [name] is a 37 y.o. female with PMHx significant for subclinical hypothyroidism,GERD,B12 def, and GAD ere today complaining of recurrent episodes of nausea, vomiting, and abdominal pain for the past two years. These episodes occur every four to five months and last for approximately three days. During these episodes, she experiences constant, severe upper abdominal pain, accompanied by nausea,vomiting, and occasional diarrhea.   Abdominal Pain This is a chronic problem. The most recent episode lasted 3 days. The pain is located in the epigastric region and RUQ. The pain is severe. Associated symptoms include diarrhea. Pertinent negatives include no arthralgias, belching, dysuria, fever, frequency, headaches, hematuria, myalgias or weight loss. The pain is relieved by Vomiting. Her past medical history is significant for abdominal surgery (hernia repair.). There is no history of irritable bowel syndrome.  She has not noted exacerbating factors.  She describes pain as a squeezing sensation, not radiated. The episodes typically begin in the early evening before the she has eaten. She reports feeling instant relief after vomiting and sometimes after defecation or passing gas. During episodes of diarrhea, the patient can have up to five or six stools in one day. No associated heartburn, dysphagia,or jaundice.  She denies any specific food, stress, or habits contributing to the episodes and has not observed blood in the stool or melena. She has had four to five episodes in the past two years.  Her mother has hx of IBS, but there is no family history of inflammatory bowel disease.  Her last menstrual period was about two weeks ago, She has an IUD in place.  She has had an ultrasound 09/2019 negative for gallstones or other abnormalities.  She  has previously taken Pepto-Bismol for these symptoms but vomited it up.   Review of Systems  Constitutional:  Negative for chills, fever and weight loss.  HENT:  Negative for mouth sores and sore throat.   Respiratory:  Negative for cough, shortness of breath and wheezing.   Cardiovascular:  Negative for chest pain and palpitations.  Gastrointestinal:  Positive for abdominal pain and diarrhea.  Endocrine: Negative for cold intolerance and heat intolerance.  Genitourinary:  Negative for decreased urine volume, dysuria, frequency and hematuria.  Musculoskeletal:  Negative for arthralgias and myalgias.  Skin:  Negative for rash.  Neurological:  Negative for syncope, weakness and headaches.  See other pertinent positives and negatives in HPI.  Current Outpatient Medications on File Prior to Visit  Medication Sig Dispense Refill   cetirizine (ZYRTEC) 10 MG tablet Take 10 mg by mouth daily.     FLUoxetine (PROZAC) 20 MG capsule TAKE 1 CAPSULE BY MOUTH EVERY DAY 90 capsule 3   No current facility-administered medications on file prior to visit.   Past Medical History:  Diagnosis Date   Anemia    reports since highschool with remote eval - takes B12 and MV with iron   Anxiety    Depression    Eating disorder    caloric restriction remotely - in college   Frequent headaches    History of stomach ulcers    no EGD, ? GERD rather then ulcer   Migraines    UTI (urinary tract infection)    Allergies  Allergen Reactions   Sulfa Antibiotics Rash   Social History   Socioeconomic History   Marital status: Married  Spouse name: Not on file   Number of children: Not on file   Years of education: Not on file   Highest education level: Not on file  Occupational History   Not on file  Tobacco Use   Smoking status: Never   Smokeless tobacco: Never  Vaping Use   Vaping Use: Never used  Substance and Sexual Activity   Alcohol use: Yes    Alcohol/week: 0.0 standard drinks of alcohol     Comment: rarely   Drug use: No   Sexual activity: Yes  Other Topics Concern   Not on file  Social History Narrative   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      Spiritual Beliefs: catholic      Lifestyle: 11/2016 - no regular exercise, vegan diet for the most part   Social Determinants of Health   Financial Resource Strain: Not on file  Food Insecurity: Not on file  Transportation Needs: Not on file  Physical Activity: Not on file  Stress: Not on file  Social Connections: Not on file    Vitals:   08/25/22 0901  BP: 100/60  Pulse: 64  Resp: 12  Temp: 98.1 F (36.7 C)  SpO2: 99%   Body mass index is 24.51 kg/m.  Physical Exam Vitals and nursing note reviewed.  Constitutional:      General: She is not in acute distress.    Appearance: She is well-developed.  HENT:     Head: Normocephalic and atraumatic.     Mouth/Throat:     Mouth: Mucous membranes are moist.     Pharynx: Oropharynx is clear.  Eyes:     Conjunctiva/sclera: Conjunctivae normal.  Cardiovascular:     Rate and Rhythm: Normal rate and regular rhythm.     Heart sounds: No murmur heard. Pulmonary:     Effort: Pulmonary effort is normal. No respiratory distress.     Breath sounds: Normal breath sounds.  Abdominal:     Palpations: Abdomen is soft. There is no hepatomegaly or mass.     Tenderness: There is abdominal tenderness in the right upper quadrant and epigastric area. There is no guarding or rebound.  Lymphadenopathy:     Cervical: No cervical adenopathy.  Skin:    General: Skin is warm.     Findings: No erythema or rash.  Neurological:     General: No focal deficit present.     Mental Status: She is alert and oriented to person, place, and time.     Cranial Nerves: No cranial nerve deficit.     Gait: Gait normal.  Psychiatric:        Mood and Affect: Mood and affect normal.    ASSESSMENT AND PLAN:  [name] was seen  today for chronic intermittent GI symptoms. Lab Results  Component Value Date   LIPASE 32.0 08/25/2022   Lab Results  Component Value Date   WBC 5.7 08/25/2022   HGB 13.7 08/25/2022   HCT 41.9 08/25/2022   MCV 90.0 08/25/2022   PLT 204.0 08/25/2022   Lab Results  Component Value Date   TSH 0.79 08/25/2022   Lab Results  Component Value Date   ALT 9 08/25/2022   AST 14 08/25/2022   ALKPHOS 39 08/25/2022   BILITOT 0.5 08/25/2022   Lab Results  Component Value Date   CREATININE 0.70 08/25/2022   BUN 12 08/25/2022   NA 138 08/25/2022  K 4.1 08/25/2022   CL 103 08/25/2022   CO2 29 08/25/2022   Lab Results  Component Value Date   CRP <1.0 08/25/2022   Lab Results  Component Value Date   WBC 5.7 08/25/2022   HGB 13.7 08/25/2022   HCT 41.9 08/25/2022   MCV 90.0 08/25/2022   PLT 204.0 08/25/2022   Upper abdominal pain Chronic. We discussed possible etiologies, ? IBS, dyspepsia. Instructed about warning signs.  GI referral placed.  -     Lipase; Future -     Ambulatory referral to Gastroenterology  Nausea and vomiting in adult She is not interested in antiemetic because vomiting helps with abdominal pain. Further recommendations according to lab results.  -     Comprehensive metabolic panel; Future -     CBC; Future -     TSH; Future -     Lipase; Future -     C-reactive protein; Future -     Ambulatory referral to Gastroenterology  Diarrhea, unspecified type Intermittent episodes for the past 2 years. Continue adequate hydration. Hx does not suggest an infectious process. GI referral placed.  -     Comprehensive metabolic panel; Future -     CBC; Future -     TSH; Future -     Ambulatory referral to Gastroenterology  Encounter for HCV screening test for low risk patient -     Hepatitis C antibody; Future   Return if symptoms worsen or fail to improve, for keep next appointment.  Tivon Lemoine G. [place], MD  [facility]. [place]  office.

## 2022-08-25 ENCOUNTER — Ambulatory Visit: Payer: BC Managed Care – PPO | Admitting: Family Medicine

## 2022-08-25 ENCOUNTER — Encounter: Payer: Self-pay | Admitting: Family Medicine

## 2022-08-25 VITALS — BP 100/60 | HR 64 | Temp 98.1°F | Resp 12 | Ht 67.0 in | Wt 156.5 lb

## 2022-08-25 DIAGNOSIS — R1010 Upper abdominal pain, unspecified: Secondary | ICD-10-CM

## 2022-08-25 DIAGNOSIS — R197 Diarrhea, unspecified: Secondary | ICD-10-CM

## 2022-08-25 DIAGNOSIS — Z1159 Encounter for screening for other viral diseases: Secondary | ICD-10-CM

## 2022-08-25 DIAGNOSIS — R112 Nausea with vomiting, unspecified: Secondary | ICD-10-CM | POA: Diagnosis not present

## 2022-08-25 LAB — COMPREHENSIVE METABOLIC PANEL
ALT: 9 U/L (ref 0–35)
AST: 14 U/L (ref 0–37)
Albumin: 4.3 g/dL (ref 3.5–5.2)
Alkaline Phosphatase: 39 U/L (ref 39–117)
BUN: 12 mg/dL (ref 6–23)
CO2: 29 mEq/L (ref 19–32)
Calcium: 9.2 mg/dL (ref 8.4–10.5)
Chloride: 103 mEq/L (ref 96–112)
Creatinine, Ser: 0.7 mg/dL (ref 0.40–1.20)
GFR: 109.61 mL/min (ref >60.00)
Glucose, Bld: 64 mg/dL — ABNORMAL LOW (ref 70–99)
Potassium: 4.1 mEq/L (ref 3.5–5.1)
Sodium: 138 mEq/L (ref 135–145)
Total Bilirubin: 0.5 mg/dL (ref 0.2–1.2)
Total Protein: 6.9 g/dL (ref 6.0–8.3)

## 2022-08-25 LAB — CBC
HCT: 41.9 % (ref 36.0–46.0)
Hemoglobin: 13.7 g/dL (ref 12.0–15.0)
MCHC: 32.7 g/dL (ref 30.0–36.0)
MCV: 90 fl (ref 78.0–100.0)
Platelets: 204 10*3/uL (ref 150.0–400.0)
RBC: 4.66 Mil/uL (ref 3.87–5.11)
RDW: 13.5 % (ref 11.5–15.5)
WBC: 5.7 10*3/uL (ref 4.0–10.5)

## 2022-08-25 LAB — LIPASE: Lipase: 32 U/L (ref 11.0–59.0)

## 2022-08-25 LAB — C-REACTIVE PROTEIN: CRP: <1 mg/dL (ref 0.5–20.0)

## 2022-08-25 LAB — TSH: TSH: 0.79 u[IU]/mL (ref 0.35–5.50)

## 2022-08-25 NOTE — Patient Instructions (Signed)
A few things to remember from today's visit:  Upper abdominal pain - Plan: Lipase  Nausea and vomiting in adult - Plan: Comprehensive metabolic panel, CBC, TSH, Lipase, C-reactive protein  Diarrhea, unspecified type - Plan: Comprehensive metabolic panel, CBC, TSH  Arrange appt with gastroenterologist. Monitor for new symptoms.  If you need refills for medications you take chronically, please call your pharmacy. Do not use My Chart to request refills or for acute issues that need immediate attention. If you send a my chart message, it may take a few days to be addressed, specially if I am not in the office.  Please be sure medication list is accurate. If a new problem present, please set up appointment sooner than planned today.

## 2022-08-26 LAB — HEPATITIS C ANTIBODY: Hepatitis C Ab: NONREACTIVE

## 2022-08-29 ENCOUNTER — Other Ambulatory Visit: Payer: Self-pay

## 2022-08-29 ENCOUNTER — Emergency Department: Payer: BC Managed Care – PPO

## 2022-08-29 ENCOUNTER — Emergency Department
Admission: EM | Admit: 2022-08-29 | Discharge: 2022-08-29 | Disposition: A | Discharge status: Home / Self Care | Payer: BC Managed Care – PPO | Attending: Emergency Medicine | Admitting: Emergency Medicine

## 2022-08-29 DIAGNOSIS — K2900 Acute gastritis without bleeding: Secondary | ICD-10-CM

## 2022-08-29 DIAGNOSIS — R1013 Epigastric pain: Secondary | ICD-10-CM | POA: Diagnosis present

## 2022-08-29 LAB — URINALYSIS, ROUTINE W REFLEX MICROSCOPIC
Bilirubin Urine: NEGATIVE
Glucose, UA: NEGATIVE mg/dL
Ketones, ur: 80 mg/dL — AB
Nitrite: NEGATIVE
Protein, ur: 30 mg/dL — AB
Specific Gravity, Urine: 1.029 (ref 1.005–1.030)
pH: 5 (ref 5.0–8.0)

## 2022-08-29 LAB — CBC WITH DIFFERENTIAL/PLATELET
Abs Immature Granulocytes: 0.02 10*3/uL (ref 0.00–0.07)
Basophils Absolute: 0.1 10*3/uL (ref 0.0–0.1)
Basophils Relative: 1 %
Eosinophils Absolute: 0.2 10*3/uL (ref 0.0–0.5)
Eosinophils Relative: 3 %
HCT: 46.1 % — ABNORMAL HIGH (ref 36.0–46.0)
Hemoglobin: 15.2 g/dL — ABNORMAL HIGH (ref 12.0–15.0)
Immature Granulocytes: 0 %
Lymphocytes Relative: 19 %
Lymphs Abs: 1.7 10*3/uL (ref 0.7–4.0)
MCH: 29.5 pg (ref 26.0–34.0)
MCHC: 33 g/dL (ref 30.0–36.0)
MCV: 89.3 fL (ref 80.0–100.0)
Monocytes Absolute: 0.6 10*3/uL (ref 0.1–1.0)
Monocytes Relative: 7 %
Neutro Abs: 6.2 10*3/uL (ref 1.7–7.7)
Neutrophils Relative %: 70 %
Platelets: 242 10*3/uL (ref 150–400)
RBC: 5.16 MIL/uL — ABNORMAL HIGH (ref 3.87–5.11)
RDW: 12.3 % (ref 11.5–15.5)
WBC: 8.9 10*3/uL (ref 4.0–10.5)
nRBC: 0 % (ref 0.0–0.2)

## 2022-08-29 LAB — COMPREHENSIVE METABOLIC PANEL
ALT: 12 U/L (ref 0–44)
AST: 13 U/L — ABNORMAL LOW (ref 15–41)
Albumin: 4.8 g/dL (ref 3.5–5.0)
Alkaline Phosphatase: 48 U/L (ref 38–126)
Anion gap: 11 (ref 5–15)
BUN: 9 mg/dL (ref 6–20)
CO2: 24 mmol/L (ref 22–32)
Calcium: 9.1 mg/dL (ref 8.9–10.3)
Chloride: 103 mmol/L (ref 98–111)
Creatinine, Ser: 0.68 mg/dL (ref 0.44–1.00)
GFR, Estimated: >60 mL/min (ref >60)
Glucose, Bld: 102 mg/dL — ABNORMAL HIGH (ref 70–99)
Potassium: 3.9 mmol/L (ref 3.5–5.1)
Sodium: 138 mmol/L (ref 135–145)
Total Bilirubin: 0.9 mg/dL (ref 0.3–1.2)
Total Protein: 7.5 g/dL (ref 6.5–8.1)

## 2022-08-29 LAB — POCT PREGNANCY, URINE

## 2022-08-29 LAB — LIPASE, BLOOD: Lipase: 40 U/L (ref 11–51)

## 2022-08-29 MED ORDER — ONDANSETRON 4 MG PO TBDP: 4.0000 mg | ORAL_TABLET | Freq: Three times a day (TID) | ORAL | 0 refills | Status: DC | PRN

## 2022-08-29 MED ORDER — SODIUM CHLORIDE 0.9 % IV SOLN: Freq: Once | INTRAVENOUS | Status: AC

## 2022-08-29 MED ORDER — ONDANSETRON HCL 4 MG/2ML IJ SOLN
4.0000 mg | Freq: Once | INTRAMUSCULAR | Status: AC
  Administered 2022-08-29: 4 mg via INTRAVENOUS
  Filled 2022-08-29: qty 2

## 2022-08-29 MED ORDER — LIDOCAINE VISCOUS HCL 2 % MT SOLN
15.0000 mL | Freq: Once | OROMUCOSAL | Status: AC
  Administered 2022-08-29: 15 mL via ORAL
  Filled 2022-08-29: qty 15

## 2022-08-29 MED ORDER — SUCRALFATE 1 G PO TABS: 1.0000 g | ORAL_TABLET | Freq: Three times a day (TID) | ORAL | 0 refills | Status: DC

## 2022-08-29 MED ORDER — IOHEXOL 300 MG/ML  SOLN
100.0000 mL | Freq: Once | INTRAMUSCULAR | Status: AC | PRN
  Administered 2022-08-29: 100 mL via INTRAVENOUS

## 2022-08-29 MED ORDER — PANTOPRAZOLE SODIUM 40 MG PO TBEC: 40.0000 mg | DELAYED_RELEASE_TABLET | Freq: Every day | ORAL | 1 refills | Status: DC

## 2022-08-29 MED ORDER — ALUM & MAG HYDROXIDE-SIMETH 200-200-20 MG/5ML PO SUSP
30.0000 mL | Freq: Once | ORAL | Status: AC
  Administered 2022-08-29: 30 mL via ORAL
  Filled 2022-08-29: qty 30

## 2022-08-29 NOTE — ED Triage Notes (Signed)
Pt states she has had upper abd pain x1week with n/v. Pt states dec po intake. Pt states pain worse since eating [employer] last night.

## 2022-08-29 NOTE — ED Provider Notes (Signed)
[facility] Provider Note    Event Date/Time   First MD Initiated Contact with Patient 08/29/22 1912     (approximate)   History   Abdominal Pain   HPI  Virginia Robinson is a 37 y.o. female with a history of GERD who presents with complaints of epigastric pain.  She reports approximately 10 days of epigastric discomfort, seem to improve yesterday although not totally better however today worsened abruptly after eating tacos.  No significant vomiting.  Did have an EGD in the past and had some erosions/ulcerations, attributed to drinking a lot of coffee which she has since improved.     Physical Exam   Triage Vital Signs: ED Triage Vitals  Enc Vitals Group     BP 08/29/22 1909 132/89     Pulse Rate 08/29/22 1909 78     Resp 08/29/22 1909 13     Temp 08/29/22 1909 98 F (36.7 C)     Temp src --      SpO2 08/29/22 1909 100 %     Weight 08/29/22 1908 68 kg (150 lb)     Height 08/29/22 1908 1.702 m (5\' 7" )     Head Circumference --      Peak Flow --      Pain Score 08/29/22 1908 7     Pain Loc --      Pain Edu? --      Excl. in GC? --     Most recent vital signs: Vitals:   08/29/22 1909 08/29/22 2222  BP: 132/89 120/77  Pulse: 78 63  Resp: 13 16  Temp: 98 F (36.7 C)   SpO2: 100% 98%     General: Awake, no distress.  CV:  Good peripheral perfusion.  Resp:  Normal effort.  Abd:  No distention.  Epigastric tenderness to palpation, soft Other:     ED Results / Procedures / Treatments   Labs (all labs ordered are listed, but only abnormal results are displayed) Labs Reviewed  COMPREHENSIVE METABOLIC PANEL - Abnormal; Notable for the following components:      Result Value   Glucose, Bld 102 (*)    AST 13 (*)    All other components within normal limits  URINALYSIS, ROUTINE W REFLEX MICROSCOPIC - Abnormal; Notable for the following components:   Color, Urine YELLOW (*)    APPearance HAZY (*)    Hgb urine dipstick SMALL (*)     Ketones, ur 80 (*)    Protein, ur 30 (*)    Leukocytes,Ua TRACE (*)    Bacteria, UA RARE (*)    All other components within normal limits  CBC WITH DIFFERENTIAL/PLATELET - Abnormal; Notable for the following components:   RBC 5.16 (*)    Hemoglobin 15.2 (*)    HCT 46.1 (*)    All other components within normal limits  LIPASE, BLOOD     EKG     RADIOLOGY CT abdomen pelvis viewed interpret by me, no acute abnormality confirmed by radiology.    PROCEDURES:  Critical Care performed:   Procedures   MEDICATIONS ORDERED IN ED: Medications  0.9 %  sodium chloride infusion (0 mLs Intravenous Stopped 08/29/22 2221)  ondansetron (ZOFRAN) injection 4 mg (4 mg Intravenous Given 08/29/22 2003)  iohexol (OMNIPAQUE) 300 MG/ML solution 100 mL (100 mLs Intravenous Contrast Given 08/29/22 2109)  alum & mag hydroxide-simeth (MAALOX/MYLANTA) 200-200-20 MG/5ML suspension 30 mL (30 mLs Oral Given 08/29/22 2221)    And  lidocaine (  XYLOCAINE) 2 % viscous mouth solution 15 mL (15 mLs Oral Given 08/29/22 2221)     IMPRESSION / MDM / ASSESSMENT AND PLAN / ED COURSE  I reviewed the triage vital signs and the nursing notes. Patient's presentation is most consistent with acute presentation with potential threat to life or bodily function.  Patient presents with epigastric abdominal pain as detailed above, differential includes gastritis, GERD, PUD, cholecystitis, pancreatitis  Offered IV morphine however patient declined, treated with IV Zofran and fluids  Lab work reviewed which demonstrates normal lipase, normal LFTs.  Will send for CT abdomen pelvis  CT scan is reassuring, patient feeling better after IV Zofran.  GI cocktail given to see if any improvement but strongly suspect gastritis versus PUD, will start on Protonix, Carafate, close follow-up with GI.  Return precautions discussed.      FINAL CLINICAL IMPRESSION(S) / ED DIAGNOSES   Final diagnoses:  Acute gastritis without hemorrhage,  unspecified gastritis type     Rx / DC Orders   ED Discharge Orders          Ordered    sucralfate (CARAFATE) 1 g tablet  3 times daily with meals & bedtime        08/29/22 2149    ondansetron (ZOFRAN-ODT) 4 MG disintegrating tablet  Every 8 hours PRN        08/29/22 2149    pantoprazole (PROTONIX) 40 MG tablet  Daily        08/29/22 2149             Note:  This document was prepared using Dragon voice recognition software and may include unintentional dictation errors.   Jene Every, MD 08/29/22 [phone]

## 2022-08-29 NOTE — ED Notes (Signed)
Pregnancy test results- negative

## 2022-09-09 ENCOUNTER — Telehealth: Payer: Self-pay | Admitting: Gastroenterology

## 2022-09-09 MED ORDER — PANTOPRAZOLE SODIUM 40 MG PO TBEC: 40.0000 mg | DELAYED_RELEASE_TABLET | Freq: Every day | ORAL | 0 refills | Status: DC

## 2022-09-09 NOTE — Addendum Note (Signed)
Addended by: Coletta Memos on: 09/09/2022 01:35 PM   Modules accepted: Orders

## 2022-09-09 NOTE — Telephone Encounter (Signed)
Informed patient that Pantoprazole has been sent to her pharmacy. Advised her she doesn't need a refill for Carafate if her symptoms have been improved. Stated she is feeling much better. Advised patient to call us with worsening symptoms before her follow up appointment scheduled on 11/04/22.

## 2022-09-09 NOTE — Telephone Encounter (Signed)
Patient is calling to see is she can have a refill for Pantoprazole and sucralfate .Marland Kitchen She was in the ED ON 6/30 and Dr. Catalina Pizza her to follow up with her gi for future refill.. she is schedule for 9/05/.Please advise

## 2022-11-04 ENCOUNTER — Encounter: Payer: Self-pay | Admitting: Gastroenterology

## 2022-11-04 ENCOUNTER — Ambulatory Visit: Payer: BC Managed Care – PPO | Admitting: Gastroenterology

## 2022-11-04 VITALS — BP 90/60 | HR 66 | Ht 67.0 in | Wt 156.0 lb

## 2022-11-04 DIAGNOSIS — R1013 Epigastric pain: Secondary | ICD-10-CM | POA: Diagnosis not present

## 2022-11-04 DIAGNOSIS — R112 Nausea with vomiting, unspecified: Secondary | ICD-10-CM

## 2022-11-04 NOTE — Patient Instructions (Signed)
You will be contacted by [facility] Scheduling in the next 2 days to arrange a HIDA Scan .  The number on your caller ID will be [phone], please answer when they call.  If you have not heard from them in 2 days please call [phone] to schedule.     Due to recent changes in healthcare laws, you may see the results of your imaging and laboratory studies on MyChart before your provider has had a chance to review them.  We understand that in some cases there may be results that are confusing or concerning to you. Not all laboratory results come back in the same time frame and the provider may be waiting for multiple results in order to interpret others.  Please give [place] 48 hours in order for your provider to thoroughly review all the results before contacting the office for clarification of your results.    _______________________________________________________  If your blood pressure at your visit was 140/90 or greater, please contact your primary care physician to follow up on this.  _______________________________________________________  If you are age 50 or older, your body mass index should be between 23-30. Your Body mass index is 24.43 kg/m. If this is out of the aforementioned range listed, please consider follow up with your Primary Care Provider.  If you are age 58 or younger, your body mass index should be between 19-25. Your Body mass index is 24.43 kg/m. If this is out of the aformentioned range listed, please consider follow up with your Primary Care Provider.   ________________________________________________________  The [facility] providers would like to encourage you to use [facility] to communicate with providers for non-urgent requests or questions.  Due to long hold times on the telephone, sending your provider a message by [facility] may be a faster and more efficient way to get a response.  Please allow 48 business hours for a response.  Please remember that this is  for non-urgent requests.  _______________________________________________________   I appreciate the  opportunity to care for you  Thank You   [facility]

## 2022-11-04 NOTE — Progress Notes (Signed)
Chief Complaint: nausea, vomiting, epigastric pain Primary GI MD: Dr. Barron Alvine  HPI: 37 year old female history of anxiety, depression, GERD, H. pylori negative gastritis, presents for evaluation of nausea, vomiting, and epigastric pain.  Patient reports she has these episodes of intense epigastric pain associated with nausea and vomiting every 4 to 5 months.  She states it feels different than her reflux pain for which she saw Dr. Barron Alvine in 2021 for and underwent EGD/colonoscopy (see below).  No identifiable trigger.  When the symptoms come on they last for about 5 days.  The most recent episode was the end of June in which she went to the ED for further evaluation.  With the symptoms she also has diarrhea.  No sick contacts. CT abdomen pelvis with contrast 08/29/2022 showed subtle wall thickening of terminal ileum (inflammatory/infectious terminal ileitis).  No gallstones.  Patient was put on Protonix once daily and Carafate 4 times daily.  She states the Carafate  relieved her symptoms.  She has been doing well since the end of June but she is concerned it may continue.  Denies family history of IBD.  Denies melena/hematochezia.  Denies weight loss.  She states she drinks multiple cups of espresso a day.  Denies NSAIDs.  Only time her symptoms are better is when she was in [place].   PREVIOUS GI WORKUP   EGD 12/2019 for epigastric pain: - Normal esophagus.  - Z- line regular, 38 cm from the incisors.  - Gastroesophageal flap valve classified as Hill Grade II ( fold present, opens with respiration) .  - Gastritis. Biopsied.  - A few gastric polyps. Resected and retrieved.  - Duodenal erosion without bleeding. Biopsied.  - Normal first portion of the duodenum and second portion of the duodenum. Biopsied.  Colonoscopy for change in bowel habits (diarrhea) 12/2019 - The entire examined colon is normal. Biopsied.  - The distal rectum and anal verge are normal on retroflexion  view.  - The examined portion of the ileum was normal.  Diagnosis 1. Surgical [P], duodenum - DUODENAL MUCOSA WITH NO SIGNIFICANT PATHOLOGIC FINDINGS. - NEGATIVE FOR INCREASED INTRAEPITHELIAL LYMPHOCYTES AND VILLOUS ARCHITECTURAL CHANGES. - DETACHED FRAGMENT OF DUODENAL MUCOSA WITH EROSION. 2. Surgical [P], gastric antrum and gastric body - GASTRIC ANTRAL MUCOSA WITH MILD REACTIVE GASTROPATHY. - GASTRIC OXYNTIC MUCOSA WITH MILD CHRONIC GASTRITIS. - WARTHIN-STARRY STAIN IS NEGATIVE FOR HELICOBACTER PYLORI. 3. Surgical [P], gastric polyps - FUNDIC GLAND POLYPS. 4. Surgical [P], right colon - COLONIC MUCOSA WITH NO SIGNIFICANT PATHOLOGIC FINDINGS. - NEGATIVE FOR ACTIVE INFLAMMATION AND OTHER ABNORMALITIES. 5. Surgical [P], left colon - COLONIC MUCOSA WITH NO SIGNIFICANT PATHOLOGIC FINDINGS. - NEGATIVE FOR ACTIVE INFLAMMATION AND OTHER ABNORMALITIES. Past Medical History:  Diagnosis Date   Anemia    reports since highschool with remote eval - takes B12 and MV with iron   Anxiety    Depression    Eating disorder    caloric restriction remotely - in college   Frequent headaches    History of stomach ulcers    no EGD, ? GERD rather then ulcer   Migraines    UTI (urinary tract infection)     Past Surgical History:  Procedure Laterality Date   HERNIA REPAIR  2014   Inguninal     Current Outpatient Medications  Medication Sig Dispense Refill   cetirizine (ZYRTEC) 10 MG tablet Take 10 mg by mouth daily.     FLUoxetine (PROZAC) 20 MG capsule TAKE 1 CAPSULE BY MOUTH EVERY DAY 90 capsule 3  pantoprazole (PROTONIX) 40 MG tablet Take 1 tablet (40 mg total) by mouth daily. 90 tablet 0   No current facility-administered medications for this visit.    Allergies as of 11/04/2022 - Review Complete 11/04/2022  Allergen Reaction Noted   Sulfa antibiotics Rash 08/26/2014    Family History  Problem Relation Age of Onset   Cancer Mother    Bladder Cancer Mother    Kidney disease  Mother    Hyperlipidemia Father    COPD Father    Hyperlipidemia Maternal Grandmother    Diabetes Maternal Grandfather    Cancer Paternal Grandmother    Breast cancer Paternal Grandmother    Cancer Paternal Grandfather    Heart disease Paternal Grandfather    Throat cancer Paternal Grandfather    Breast cancer Maternal Aunt    Uterine cancer Maternal Aunt    Breast cancer Maternal Aunt    Ovarian cancer Maternal Aunt    Bone cancer Paternal Uncle    Colon cancer Neg Hx    Esophageal cancer Neg Hx    Ulcerative colitis Neg Hx    Stomach cancer Neg Hx     Social History   Socioeconomic History   Marital status: Married    Spouse name: Not on file   Number of children: Not on file   Years of education: Not on file   Highest education level: Not on file  Occupational History   Not on file  Tobacco Use   Smoking status: Never   Smokeless tobacco: Never  Vaping Use   Vaping status: Never Used  Substance and Sexual Activity   Alcohol use: Yes    Alcohol/week: 0.0 standard drinks of alcohol    Comment: rarely   Drug use: No   Sexual activity: Yes  Other Topics Concern   Not on file  Social History Narrative   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      Spiritual Beliefs: catholic      Lifestyle: 11/2016 - no regular exercise, vegan diet for the most part   Social Determinants of Health   Financial Resource Strain: Not on file  Food Insecurity: Not on file  Transportation Needs: Not on file  Physical Activity: Not on file  Stress: Not on file  Social Connections: Unknown (07/14/2021)   Received from [facility]   Social Network    Social Network: Not on file  Intimate Partner Violence: Unknown (06/05/2021)   Received from [facility]   HITS    Physically Hurt: Not on file    Insult or Talk Down To: Not on file    Threaten Physical Harm: Not on file    Scream or Curse: Not on file    Review of  Systems:    Constitutional: No weight loss, fever, chills, weakness or fatigue HEENT: Eyes: No change in vision               Ears, Nose, Throat:  No change in hearing or congestion Skin: No rash or itching Cardiovascular: No chest pain, chest pressure or palpitations   Respiratory: No SOB or cough Gastrointestinal: See HPI and otherwise negative Genitourinary: No dysuria or change in urinary frequency Neurological: No headache, dizziness or syncope Musculoskeletal: No new muscle or joint pain Hematologic: No bleeding or bruising Psychiatric: No history of depression or anxiety    Physical Exam:  Vital signs: BP 90/60   Pulse 66  Ht 5\' 7"  (1.702 m)   Wt 70.8 kg   SpO2 99%   BMI 24.43 kg/m   Constitutional: NAD, Well developed, Well nourished, alert and cooperative Head:  Normocephalic and atraumatic. Eyes:   PEERL, EOMI. No icterus. Conjunctiva pink. Respiratory: Respirations even and unlabored. Lungs clear to auscultation bilaterally.   No wheezes, crackles, or rhonchi.  Cardiovascular:  Regular rate and rhythm. No peripheral edema, cyanosis or pallor.  Gastrointestinal:  Soft, nondistended, nontender. No rebound or guarding. Normal bowel sounds. No appreciable masses or hepatomegaly. Rectal:  Not performed.  Msk:  Symmetrical without gross deformities. Without edema, no deformity or joint abnormality.  Neurologic:  Alert and  oriented x4;  grossly normal neurologically.  Skin:   Dry and intact without significant lesions or rashes. Psychiatric: Oriented to person, place and time. Demonstrates good judgement and reason without abnormal affect or behaviors.   RELEVANT LABS AND IMAGING: CBC    Component Value Date/Time   WBC 8.9 08/29/2022 1910   RBC 5.16 (H) 08/29/2022 1910   HGB 15.2 (H) 08/29/2022 1910   HCT 46.1 (H) 08/29/2022 1910   PLT 242 08/29/2022 1910   MCV 89.3 08/29/2022 1910   MCH 29.5 08/29/2022 1910   MCHC 33.0 08/29/2022 1910   RDW 12.3 08/29/2022  1910   LYMPHSABS 1.7 08/29/2022 1910   MONOABS 0.6 08/29/2022 1910   EOSABS 0.2 08/29/2022 1910   BASOSABS 0.1 08/29/2022 1910    CMP     Component Value Date/Time   NA 138 08/29/2022 1910   K 3.9 08/29/2022 1910   CL 103 08/29/2022 1910   CO2 24 08/29/2022 1910   GLUCOSE 102 (H) 08/29/2022 1910   BUN 9 08/29/2022 1910   CREATININE 0.68 08/29/2022 1910   CALCIUM 9.1 08/29/2022 1910   PROT 7.5 08/29/2022 1910   ALBUMIN 4.8 08/29/2022 1910   AST 13 (L) 08/29/2022 1910   ALT 12 08/29/2022 1910   ALKPHOS 48 08/29/2022 1910   BILITOT 0.9 08/29/2022 1910   GFRNONAA >60 08/29/2022 1910   GFRAA >60 09/05/2019 1246     Assessment/Plan:   37 year old female with history of anxiety presenting with nausea, vomiting, epigastric pain that occurs every 4 to 5 months and when she has flares that last about 5 days.  Recent CT showing terminal ileitis, no gallstones.  No sick contacts.  Not having symptoms at this time.  Symptoms resolved with Protonix daily and Carafate 4 times daily.  Nausea and vomiting, unspecified vomiting type Abdominal pain, epigastric Discussed with patient DDx includes esophagitis, gastritis, PUD.  With presence of terminal ileitis on imaging could also be IBD, though recent colonoscopy with negative biopsies is reassuring.  Although negative gallstones, could still be biliary dyskinesia.  Discussed getting possible repeat EGD +/- colonoscopy as well as HIDA scan.  Joint decision making, patient elected to proceed with HIDA scan and go from there. --- HIDA scan with ejection fraction to evaluate for biliary dyskinesia --- Educated patient on lifestyle modifications and provided GERD patient education handouts --- Continue Protonix 40 Mg daily --- If HIDA scan is negative we will consider proceeding with EGD +/- colonoscopy for further evaluation --- If flare occurs, please let us know and we can try to get CRP/fecal calprotectin during the time of flare   Virginia Robinson  [facility] 11/04/2022, 9:24 AM  Cc: [place], Betty G, MD

## 2022-11-09 ENCOUNTER — Telehealth: Payer: Self-pay | Admitting: *Deleted

## 2022-11-09 DIAGNOSIS — R1013 Epigastric pain: Secondary | ICD-10-CM

## 2022-11-09 DIAGNOSIS — R112 Nausea with vomiting, unspecified: Secondary | ICD-10-CM

## 2022-11-09 NOTE — Telephone Encounter (Signed)
Patient's insurance has denied HIDA scan (scheduled for 10/16/22).   Clinical Rationale:  Your doctor told us that you have pain in the right upper abdomen. Your doctor is checking you for a swelling of the gallbladder causing pain. Your doctor ordered a special test to check your liver and gallbladder. This test is needed when ultrasound results were unclear. We reviewed the notes we have. The notes do not show that you had an ultrasound. Based on the information we have, this test is not medically necessary. We used [employer] Medical Benefits Management Clinical Guideline titled Nuclear Medicine Imaging to make this decision. You may view this guideline at [email or web address]/mbm-[email or web address].

## 2022-11-10 NOTE — Telephone Encounter (Signed)
I have spoken with Boone Master, PA-C regarding denial. Patient had a CT done 08/29/22 which should negate the need for ultrasound. However, since insurance is requiring ultrasound prior to HIDA approval, we will order RUQ abdominal ultrasoun and cancel HIDA for now.

## 2022-11-10 NOTE — Telephone Encounter (Signed)
Patient HIDA cancelled. U/S scheduled for Wednesdeay, 11/17/22 at 8 am, [facility], 745 am arrival, NPO midnight. I have contacted the patient and advised her of the change due to insurance requirement. She verbalizes understanding.

## 2022-11-16 ENCOUNTER — Other Ambulatory Visit (HOSPITAL_COMMUNITY): Payer: BC Managed Care – PPO

## 2022-11-17 ENCOUNTER — Other Ambulatory Visit: Payer: Self-pay | Admitting: *Deleted

## 2022-11-17 ENCOUNTER — Ambulatory Visit (HOSPITAL_COMMUNITY)
Admission: RE | Admit: 2022-11-17 | Discharge: 2022-11-17 | Disposition: A | Discharge status: Home / Self Care | Payer: BC Managed Care – PPO | Source: Ambulatory Visit | Attending: Gastroenterology | Admitting: Gastroenterology

## 2022-11-17 DIAGNOSIS — R112 Nausea with vomiting, unspecified: Secondary | ICD-10-CM | POA: Diagnosis present

## 2022-11-17 DIAGNOSIS — R1013 Epigastric pain: Secondary | ICD-10-CM | POA: Diagnosis present

## 2022-11-17 NOTE — Progress Notes (Signed)
Agree with the assessment and plan as outlined by Bailey McMichael, PA-C.  Dawanda Mapel, DO, FACG  

## 2022-11-24 ENCOUNTER — Other Ambulatory Visit (HOSPITAL_COMMUNITY): Payer: BC Managed Care – PPO

## 2022-11-25 ENCOUNTER — Ambulatory Visit (HOSPITAL_COMMUNITY)
Admission: RE | Admit: 2022-11-25 | Discharge: 2022-11-25 | Disposition: A | Discharge status: Home / Self Care | Payer: BC Managed Care – PPO | Source: Ambulatory Visit | Attending: Gastroenterology | Admitting: Gastroenterology

## 2022-11-25 DIAGNOSIS — R112 Nausea with vomiting, unspecified: Secondary | ICD-10-CM | POA: Insufficient documentation

## 2022-11-25 DIAGNOSIS — R1013 Epigastric pain: Secondary | ICD-10-CM | POA: Insufficient documentation

## 2022-11-25 MED ORDER — TECHNETIUM TC 99M MEBROFENIN IV KIT
5.1000 | PACK | Freq: Once | INTRAVENOUS | Status: AC | PRN
  Administered 2022-11-25: 5.1 via INTRAVENOUS

## 2022-12-24 ENCOUNTER — Other Ambulatory Visit: Payer: Self-pay | Admitting: Gastroenterology

## 2023-02-07 NOTE — Progress Notes (Signed)
   ACUTE VISIT No chief complaint on file.  HPI: Ms.Koralyn Adamarie Carrubba is a 37 y.o. female with a PMHx significant for subclinical hypothyroidism, GERD, B12 deficiency, anxiety, depression, and anemia, who is here today complaining of a bilateral leg rash.    Review of Systems See other pertinent positives and negatives in HPI.  Current Outpatient Medications on File Prior to Visit  Medication Sig Dispense Refill   cetirizine (ZYRTEC) 10 MG tablet Take 10 mg by mouth daily.     FLUoxetine (PROZAC) 20 MG capsule TAKE 1 CAPSULE BY MOUTH EVERY DAY 90 capsule 3   pantoprazole (PROTONIX) 40 MG tablet TAKE 1 TABLET BY MOUTH EVERY DAY 90 tablet 0   No current facility-administered medications on file prior to visit.    Past Medical History:  Diagnosis Date   Anemia    reports since highschool with remote eval - takes B12 and MV with iron   Anxiety    Depression    Eating disorder    caloric restriction remotely - in college   Frequent headaches    History of stomach ulcers    no EGD, ? GERD rather then ulcer   Migraines    UTI (urinary tract infection)    Allergies  Allergen Reactions   Sulfa Antibiotics Rash    Social History   Socioeconomic History   Marital status: Married    Spouse name: Not on file   Number of children: Not on file   Years of education: Not on file   Highest education level: Not on file  Occupational History   Not on file  Tobacco Use   Smoking status: Never   Smokeless tobacco: Never  Vaping Use   Vaping status: Never Used  Substance and Sexual Activity   Alcohol use: Yes    Alcohol/week: 0.0 standard drinks of alcohol    Comment: rarely   Drug use: No   Sexual activity: Yes  Other Topics Concern   Not on file  Social History Narrative   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 There is no height or weight on file to calculate BMI.  Physical Exam  ASSESSMENT AND PLAN:  Ms. Shover was seen today for a bilateral leg rash.   There are no diagnoses linked to this encounter.  No follow-ups on file.  I, Suanne Marker, acting as a scribe for Tinnie Kunin [place], MD., have documented all relevant documentation on the behalf of Cristin Penaflor [place], MD, as directed by  Delainey Winstanley [place], MD while in the presence of Demitrious Mccannon [place], MD.   I, Suanne Marker, have reviewed all documentation for this visit. The documentation on 02/07/23 for the exam, diagnosis, procedures, and orders are all accurate and complete.  Dellas Guard G. [place], MD  [facility]. [place] [facility].  Discharge Instructions   None

## 2023-02-08 ENCOUNTER — Encounter: Payer: Self-pay | Admitting: Family Medicine

## 2023-02-08 ENCOUNTER — Ambulatory Visit: Payer: BC Managed Care – PPO | Admitting: Family Medicine

## 2023-02-08 VITALS — BP 110/70 | HR 90 | Temp 98.7°F | Resp 12 | Ht 67.0 in | Wt 160.0 lb

## 2023-02-08 DIAGNOSIS — L282 Other prurigo: Secondary | ICD-10-CM

## 2023-02-08 DIAGNOSIS — B354 Tinea corporis: Secondary | ICD-10-CM | POA: Diagnosis not present

## 2023-02-08 MED ORDER — FLUCONAZOLE 150 MG PO TABS
150.0000 mg | ORAL_TABLET | ORAL | 0 refills | Status: AC
Start: 2023-02-08 — End: 2023-02-23

## 2023-02-08 MED ORDER — CLOTRIMAZOLE-BETAMETHASONE 1-0.05 % EX CREA: 1.0000 | TOPICAL_CREAM | Freq: Every day | CUTANEOUS | 0 refills | Status: DC

## 2023-02-08 NOTE — Patient Instructions (Addendum)
A few things to remember from today's visit:  Pruritic rash - Plan: clotrimazole-betamethasone (LOTRISONE) cream  Tinea corporis - Plan: fluconazole (DIFLUCAN) 150 MG tablet  Selsun blue for neck down and rinse after 10 min.  If you need refills for medications you take chronically, please call your pharmacy. Do not use My Chart to request refills or for acute issues that need immediate attention. If you send a my chart message, it may take a few days to be addressed, specially if I am not in the office.  Please be sure medication list is accurate. If a new problem present, please set up appointment sooner than planned today.

## 2023-03-29 ENCOUNTER — Ambulatory Visit: Payer: Self-pay | Admitting: Gastroenterology

## 2023-04-17 ENCOUNTER — Other Ambulatory Visit: Payer: Self-pay | Admitting: Family Medicine

## 2023-04-17 DIAGNOSIS — F321 Major depressive disorder, single episode, moderate: Secondary | ICD-10-CM

## 2023-04-17 DIAGNOSIS — F411 Generalized anxiety disorder: Secondary | ICD-10-CM

## 2023-04-18 MED ORDER — FLUOXETINE HCL 20 MG PO CAPS
ORAL_CAPSULE | ORAL | 1 refills | Status: DC
Start: 2023-04-18 — End: 2023-07-29

## 2023-07-06 ENCOUNTER — Encounter: Payer: Self-pay | Admitting: Family Medicine

## 2023-07-06 ENCOUNTER — Ambulatory Visit: Admitting: Family Medicine

## 2023-07-06 VITALS — BP 92/64 | HR 65 | Temp 98.3°F | Resp 12 | Ht 67.0 in | Wt 164.6 lb

## 2023-07-06 DIAGNOSIS — L819 Disorder of pigmentation, unspecified: Secondary | ICD-10-CM

## 2023-07-06 DIAGNOSIS — D582 Other hemoglobinopathies: Secondary | ICD-10-CM

## 2023-07-06 LAB — CBC
HCT: 41.8 % (ref 36.0–46.0)
Hemoglobin: 13.9 g/dL (ref 12.0–15.0)
MCHC: 33.2 g/dL (ref 30.0–36.0)
MCV: 89.7 fl (ref 78.0–100.0)
Platelets: 199 10*3/uL (ref 150.0–400.0)
RBC: 4.66 Mil/uL (ref 3.87–5.11)
RDW: 13 % (ref 11.5–15.5)
WBC: 7.3 10*3/uL (ref 4.0–10.5)

## 2023-07-06 LAB — HEPATIC FUNCTION PANEL
ALT: 14 U/L (ref 0–35)
AST: 15 U/L (ref 0–37)
Albumin: 4.5 g/dL (ref 3.5–5.2)
Alkaline Phosphatase: 46 U/L (ref 39–117)
Bilirubin, Direct: 0.1 mg/dL (ref 0.0–0.3)
Total Bilirubin: 0.3 mg/dL (ref 0.2–1.2)
Total Protein: 6.9 g/dL (ref 6.0–8.3)

## 2023-07-06 LAB — TSH: TSH: 1.06 u[IU]/mL (ref 0.35–5.50)

## 2023-07-06 NOTE — Progress Notes (Unsigned)
 ACUTE VISIT Chief Complaint  Patient presents with   Jaundice    Around eyes off and on x2-72m   HPI: Virginia Robinson is a 38 y.o. female with a PMHx significant for subclinical hypothyroidism, GERD, B12 deficiency, anxiety, depression, and anemia, who is here today complaining of pigmentation changes around her eyes.   Patient complains of intermittent jaundice around her eyes for 2-3 months. She says her mother has been asking her to be seen for it, and It was slightly worse over the weekend.  She has not had any jaundice inside her eyes.  She believes the problem may be related to fatigue. Reports she is sleeping about 6 hours per night.  Denies any unusual nausea, vomiting, abdominal pain, skin pruritus, cough, or wheezing.     Component Value Date/Time   WBC 8.9 08/29/2022 1910   RBC 5.16 (H) 08/29/2022 1910   HGB 15.2 (H) 08/29/2022 1910   HCT 46.1 (H) 08/29/2022 1910   PLT 242 08/29/2022 1910   MCV 89.3 08/29/2022 1910   MCH 29.5 08/29/2022 1910   MCHC 33.0 08/29/2022 1910   RDW 12.3 08/29/2022 1910   LYMPHSABS 1.7 08/29/2022 1910   MONOABS 0.6 08/29/2022 1910   EOSABS 0.2 08/29/2022 1910   BASOSABS 0.1 08/29/2022 1910      Latest Ref Rng & Units 08/29/2022    7:10 PM 08/25/2022    9:38 AM 03/23/2021    9:34 AM  Hepatic Function  Total Protein 6.5 - 8.1 g/dL 7.5  6.9  7.1   Albumin 3.5 - 5.0 g/dL 4.8  4.3  4.6   AST 15 - 41 U/L 13  14  14    ALT 0 - 44 U/L 12  9  12    Alk Phosphatase 38 - 126 U/L 48  39  41   Total Bilirubin 0.3 - 1.2 mg/dL 0.9  0.5  0.4    Review of Systems  Constitutional:  Positive for fatigue. Negative for activity change, appetite change and fever.  HENT:  Negative for mouth sores, nosebleeds, sore throat and trouble swallowing.   Eyes:  Negative for redness and visual disturbance.  Respiratory:  Negative for cough, shortness of breath and wheezing.   Cardiovascular:  Negative for chest pain, palpitations and leg swelling.   Gastrointestinal:  Negative for blood in stool and vomiting.       Negative for changes in bowel habits.  Endocrine: Negative for cold intolerance and heat intolerance.  Genitourinary:  Negative for decreased urine volume and hematuria.  Skin:  Negative for rash.  Neurological:  Negative for syncope, weakness and headaches.  See other pertinent positives and negatives in HPI.  Current Outpatient Medications on File Prior to Visit  Medication Sig Dispense Refill   cetirizine (ZYRTEC) 10 MG tablet Take 10 mg by mouth daily.     clotrimazole -betamethasone  (LOTRISONE ) cream Apply 1 Application topically daily. 30 g 0   FLUoxetine  (PROZAC ) 20 MG capsule TAKE 1 CAPSULE BY MOUTH EVERY DAY 90 capsule 1   pantoprazole  (PROTONIX ) 40 MG tablet TAKE 1 TABLET BY MOUTH EVERY DAY 90 tablet 0   No current facility-administered medications on file prior to visit.   Past Medical History:  Diagnosis Date   Anemia    reports since highschool with remote eval - takes B12 and MV with iron   Anxiety    Depression    Eating disorder    caloric restriction remotely - in college   Frequent headaches  History of stomach ulcers    no EGD, ? GERD rather then ulcer   Migraines    UTI (urinary tract infection)    Allergies  Allergen Reactions   Sulfa Antibiotics Rash   Social History   Socioeconomic History   Marital status: Married    Spouse name: Not on file   Number of children: Not on file   Years of education: Not on file   Highest education level: Not on file  Occupational History   Not on file  Tobacco Use   Smoking status: Never   Smokeless tobacco: Never  Vaping Use   Vaping status: Never Used  Substance and Sexual Activity   Alcohol use: Yes    Alcohol/week: 0.0 standard drinks of alcohol    Comment: rarely   Drug use: No   Sexual activity: Yes  Other Topics Concern   Not on file  Social History Narrative   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  Physical Exam Vitals and nursing note reviewed.  Constitutional:      General: She is not in acute distress.    Appearance: She is well-developed.  HENT:     Head: Normocephalic and atraumatic.     Mouth/Throat:     Mouth: Mucous membranes are moist.     Pharynx: Oropharynx is clear. Uvula midline.  Eyes:     General: No scleral icterus.    Conjunctiva/sclera: Conjunctivae normal.  Cardiovascular:     Rate and Rhythm: Normal rate and regular rhythm.     Heart sounds: No murmur heard. Pulmonary:     Effort: Pulmonary effort is normal. No respiratory distress.     Breath sounds: Normal breath sounds.  Abdominal:     Palpations: Abdomen is soft. There is no hepatomegaly or mass.     Tenderness: There is no abdominal tenderness.  Musculoskeletal:     Right lower leg: No edema.     Left lower leg: No edema.  Lymphadenopathy:     Cervical: No cervical adenopathy.  Skin:    General: Skin is warm.     Findings: No erythema or rash.     Comments: ***  Neurological:     General: No focal deficit present.     Mental Status: She is alert and oriented to person, place, and time.     Cranial Nerves: No cranial nerve deficit.     Gait: Gait normal.  Psychiatric:        Mood and Affect: Mood and affect normal.   ASSESSMENT AND PLAN:  Virginia Robinson was seen tonight for jaundice around her  eyes.   Change in pigmented skin lesion of face Moderate periocular pigmentation changes , bilateral with a tinge  of*** yellowish on lower eye lids. No icterus. We discussed possible etiologies. Hx and examination do not suggest a serious process. ? Melasma. Will check some labs today. Instructed to monitor for new symptoms. Avoid direct UV exposure and use sun screen.  -     Hepatic function panel; Future -     TSH; Future  Elevated hemoglobin (HCC) H/H has been mildly elevated. No hx of tobacco use or chronic respiratory illness. Further recommendation will be given according to CBC result.  -     CBC; Future  Return if symptoms worsen or fail to improve, for keep next appointment.  I, Fritz Jewel Wierda, acting as a scribe for Zoey Bidwell [place], MD., have documented all relevant documentation on the behalf of Jerris Keltz [place], MD, as directed by  Reika Callanan [place], MD while in the presence of Hannibal Skalla [place], MD.   I, Travares Nelles [place], MD, have reviewed all documentation for this visit. The documentation on 07/06/23 for the exam, diagnosis, procedures, and orders are all accurate and complete.  Herberta Pickron G. [place], MD  [facility]. [place] office.

## 2023-07-06 NOTE — Patient Instructions (Addendum)
 A few things to remember from today's visit:  Change in pigmented skin lesion of face - Plan: Hepatic function panel, TSH  Elevated hemoglobin (HCC) - Plan: CBC  Continue monitoring for changes. Sun screen.  Do not use My Chart to request refills or for acute issues that need immediate attention. If you send a my chart message, it may take a few days to be addressed, specially if I am not in the office.  Please be sure medication list is accurate. If a new problem present, please set up appointment sooner than planned today.

## 2023-07-28 ENCOUNTER — Other Ambulatory Visit: Payer: Self-pay | Admitting: Family Medicine

## 2023-07-28 DIAGNOSIS — F321 Major depressive disorder, single episode, moderate: Secondary | ICD-10-CM

## 2023-07-28 DIAGNOSIS — F411 Generalized anxiety disorder: Secondary | ICD-10-CM

## 2023-08-16 ENCOUNTER — Encounter: Payer: Self-pay | Admitting: Gastroenterology

## 2023-08-16 ENCOUNTER — Ambulatory Visit: Admitting: Gastroenterology

## 2023-08-16 VITALS — BP 98/60 | HR 68 | Ht 67.0 in | Wt 164.2 lb

## 2023-08-16 DIAGNOSIS — K828 Other specified diseases of gallbladder: Secondary | ICD-10-CM | POA: Diagnosis not present

## 2023-08-16 DIAGNOSIS — R110 Nausea: Secondary | ICD-10-CM

## 2023-08-16 DIAGNOSIS — R1012 Left upper quadrant pain: Secondary | ICD-10-CM | POA: Diagnosis not present

## 2023-08-16 MED ORDER — DICYCLOMINE HCL 10 MG PO CAPS: 10.0000 mg | ORAL_CAPSULE | Freq: Three times a day (TID) | ORAL | 0 refills | Status: DC

## 2023-08-16 NOTE — Progress Notes (Signed)
 Chief Complaint:follow-up Primary GI Doctor:Dr. Karene Oto  HPI:  38 year old female history of anxiety, depression, GERD, H. pylori negative gastritis, who presents for follow-up.  Last seen in the GI office by [facility] on 11/04/22 for evaluation of nausea, vomiting, and epigastric pain. HIDA scan ordered. Repot below.  12/16/22 seen by Dr. Lillette Reid at [facility] for (Biliary dyskinesia/discuss cholecystectomy). They discussed how it is difficult to know if this is truly the source of her symptoms but with her history of previous peptic ulcer disease that would seem more likely. Dr. Alethea Andes felt it would be reasonable to have her evaluated by gastroenterology with an upper endoscopy and if we could not find any other source of her pain then it would be possible to remove the gallbladder.   Interval History    Patient presents for follow-up. We spent several minutes discussing and reviewing her case. She would prefer not to have gallbladder surgery if it can be prevented. She tells me her symptoms all started back in 2021. We reviewed her EGD/colon that were done at that time.She will experience intermittent LUQ pain she states happens on a daily basis. No known triggers. She has tried various diets. She has tried PPI therapy with no improvement. Records show she was even treated for Sibo with no improvement. She tells me the only thing she found that gave her some relief after severe episode about 1 year ago where she went to ER was Carafate . She also experiences daily nausea with no vomiting along with a sour/bitter taste in her mouth all the time. No NSAIDs. She reports the bloating has improved with Probiotic po daily. No diarrhea or constipation.   Patient does admit to stress. She states she felt her best when she was on a trip in [place]. 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   PREVIOUS GI WORKUP    EGD 12/2019 for epigastric pain: - Normal esophagus.  - Z- line  regular, 38 cm from the incisors.  - Gastroesophageal flap valve classified as Hill Grade II ( fold present, opens with respiration) .  - Gastritis. Biopsied.  - A few gastric polyps. Resected and retrieved.  - Duodenal erosion without bleeding. Biopsied.  - Normal first portion of the duodenum and second portion of the duodenum. Biopsied.   Colonoscopy for change in bowel habits (diarrhea) 12/2019 - The entire examined colon is normal. Biopsied.  - The distal rectum and anal verge are normal on retroflexion view.  - The examined portion of the ileum was normal.   Diagnosis 1. Surgical [P], duodenum - DUODENAL MUCOSA WITH NO SIGNIFICANT PATHOLOGIC FINDINGS. - NEGATIVE FOR INCREASED INTRAEPITHELIAL LYMPHOCYTES AND VILLOUS ARCHITECTURAL CHANGES. - DETACHED FRAGMENT OF DUODENAL MUCOSA WITH EROSION. 2. Surgical [P], gastric antrum and gastric body - GASTRIC ANTRAL MUCOSA WITH MILD REACTIVE GASTROPATHY. - GASTRIC OXYNTIC MUCOSA WITH MILD CHRONIC GASTRITIS. - WARTHIN-STARRY STAIN IS NEGATIVE FOR HELICOBACTER PYLORI. 3. Surgical [P], gastric polyps - FUNDIC GLAND POLYPS. 4. Surgical [P], right colon - COLONIC MUCOSA WITH NO SIGNIFICANT PATHOLOGIC FINDINGS. - NEGATIVE FOR ACTIVE INFLAMMATION AND OTHER ABNORMALITIES. 5. Surgical [P], left colon - COLONIC MUCOSA WITH NO SIGNIFICANT PATHOLOGIC FINDINGS. - NEGATIVE FOR ACTIVE INFLAMMATION AND OTHER ABNORMALITIES.  11/25/22 NM Hepato W/EF MPRESSION: 1.  Patent cystic and common bile ducts. 2. Elevated gallbladder ejection fraction which Ravon Mortellaro reflect gallbladder hyperkinesis.  Referral placed to [facility]  11/17/22 US  ABD limited RUQ IMPRESSION: No cholelithiasis or sonographic evidence for acute cholecystitis.  08/29/22 CT ABD/pelvis W contrast IMPRESSION: 1. Mild relative hyperemia involving the terminal ileum with subtle associated bowel wall thickening which Alayah Knouff reflect changes of mild infectious or inflammatory terminal ileitis. No evidence  of obstruction or perforation.    Wt Readings from Last 3 Encounters:  08/16/23 164 lb 4 oz (74.5 kg)  07/06/23 164 lb 9.6 oz (74.7 kg)  02/08/23 160 lb (72.6 kg)      Past Medical History:  Diagnosis Date   Anemia    reports since highschool with remote eval - takes B12 and MV with iron   Anxiety    Depression    Eating disorder    caloric restriction remotely - in college   Frequent headaches    History of stomach ulcers    no EGD, ? GERD rather then ulcer   Migraines    UTI (urinary tract infection)     Past Surgical History:  Procedure Laterality Date   HERNIA REPAIR  2014   Inguninal     Current Outpatient Medications  Medication Sig Dispense Refill   cetirizine (ZYRTEC) 10 MG tablet Take 10 mg by mouth daily.     dicyclomine (BENTYL) 10 MG capsule Take 1 capsule (10 mg total) by mouth 4 (four) times daily -  before meals and at bedtime. 45 capsule 0   FLUoxetine  (PROZAC ) 20 MG capsule TAKE 1 CAPSULE BY MOUTH EVERY DAY 90 capsule 1   clotrimazole -betamethasone  (LOTRISONE ) cream Apply 1 Application topically daily. (Patient not taking: Reported on 08/16/2023) 30 g 0   pantoprazole  (PROTONIX ) 40 MG tablet TAKE 1 TABLET BY MOUTH EVERY DAY (Patient not taking: Reported on 08/16/2023) 90 tablet 0   No current facility-administered medications for this visit.    Allergies as of 08/16/2023 - Review Complete 08/16/2023  Allergen Reaction Noted   Sulfa antibiotics Rash 08/26/2014    Family History  Problem Relation Age of Onset   Cancer Mother    Bladder Cancer Mother    Kidney disease Mother    Hyperlipidemia Father    COPD Father    Hyperlipidemia Maternal Grandmother    Diabetes Maternal Grandfather    Cancer Paternal Grandmother    Breast cancer Paternal Grandmother    Cancer Paternal Grandfather    Heart disease Paternal Grandfather    Throat cancer Paternal Grandfather    Breast cancer Maternal Aunt    Uterine cancer Maternal Aunt    Breast cancer  Maternal Aunt    Ovarian cancer Maternal Aunt    Bone cancer Paternal Uncle    Colon cancer Neg Hx    Esophageal cancer Neg Hx    Ulcerative colitis Neg Hx    Stomach cancer Neg Hx     Review of Systems:    Constitutional: No weight loss, fever, chills, weakness or fatigue HEENT: Eyes: No change in vision               Ears, Nose, Throat:  No change in hearing or congestion Skin: No rash or itching Cardiovascular: No chest pain, chest pressure or palpitations   Respiratory: No SOB or cough Gastrointestinal: See HPI and otherwise negative Genitourinary: No dysuria or change in urinary frequency Neurological: No headache, dizziness or syncope Musculoskeletal: No new muscle or joint pain Hematologic: No bleeding or bruising Psychiatric: No history of depression or anxiety    Physical Exam:  Vital signs: BP 98/60 (BP Location: Left Arm, Patient Position: Sitting, Cuff Size: Large)   Pulse 68   Ht 5' 7 (1.702  m)   Wt 164 lb 4 oz (74.5 kg)   BMI 25.73 kg/m   Constitutional:   Pleasant  female appears to be in NAD, Well developed, Well nourished, alert and cooperative Throat: Oral cavity and pharynx without inflammation, swelling or lesion.  Respiratory: Respirations even and unlabored. Lungs clear to auscultation bilaterally.   No wheezes, crackles, or rhonchi.  Cardiovascular: Normal S1, S2. Regular rate and rhythm. No peripheral edema, cyanosis or pallor.  Gastrointestinal:  Soft, nondistended, LUQ abd tenderness with palpation. No rebound or guarding. Normal bowel sounds. No appreciable masses or hepatomegaly. Rectal:  Not performed.  Msk:  Symmetrical without gross deformities. Without edema, no deformity or joint abnormality.  Neurologic:  Alert and  oriented x4;  grossly normal neurologically.  Skin:   Dry and intact without significant lesions or rashes. Psychiatric: Oriented to person, place and time. Demonstrates good judgement and reason without abnormal affect or  behaviors.  RELEVANT LABS AND IMAGING: CBC    Latest Ref Rng & Units 07/06/2023    1:41 PM 08/29/2022    7:10 PM 08/25/2022    9:38 AM  CBC  WBC 4.0 - 10.5 K/uL 7.3  8.9  5.7   Hemoglobin 12.0 - 15.0 g/dL 56.3  87.5  64.3   Hematocrit 36.0 - 46.0 % 41.8  46.1  41.9   Platelets 150.0 - 400.0 K/uL 199.0  242  204.0      CMP     Latest Ref Rng & Units 07/06/2023    1:41 PM 08/29/2022    7:10 PM 08/25/2022    9:38 AM  CMP  Glucose 70 - 99 mg/dL  329  64   BUN 6 - 20 mg/dL  9  12   Creatinine 5.18 - 1.00 mg/dL  8.41  6.60   Sodium 630 - 145 mmol/L  138  138   Potassium 3.5 - 5.1 mmol/L  3.9  4.1   Chloride 98 - 111 mmol/L  103  103   CO2 22 - 32 mmol/L  24  29   Calcium 8.9 - 10.3 mg/dL  9.1  9.2   Total Protein 6.0 - 8.3 g/dL 6.9  7.5  6.9   Total Bilirubin 0.2 - 1.2 mg/dL 0.3  0.9  0.5   Alkaline Phos 39 - 117 U/L 46  48  39   AST 0 - 37 U/L 15  13  14    ALT 0 - 35 U/L 14  12  9       Lab Results  Component Value Date   TSH 1.06 07/06/2023     Assessment: Encounter Diagnoses  Name Primary?   LUQ abdominal pain Yes   Nausea without vomiting    Biliary dyskinesia      38 year old female patient with history of left upper quadrant discomfort, dyspepsia, nausea, and waterbrash.  EGD back in 2021 showed gastritis and small duodenal erosion.  Patient not using NSAIDs.  Was negative for H. pylori.  She has tried PPI therapy without improvement.  Patient also treated for possible SIBO with Xifaxan  with no improvement.  She has had multiple imaging over the course of the last year.  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 Will discuss with Dr. Karene Oto about pursuing pH/Mii vs Bravo placement to evaluate level of esophageal acidification. Will trial short script of bentyl to see if this helps with the LUQ abd pain.   Plan: -  Defer procedures to Dr. Karene Oto --> Esophageal  Manometry and pH/impedance testing vs EGD Bravo (patient off PPI Therapy) -antiemetics prn - Will send short script of Bentyl 10 mg prn for abdominal pain  Thank you for the courtesy of this consult. Please call me with any questions or concerns.   Sabas Frett, FNP-C [facility] 08/16/2023, 4:26 PM  Cc: [place], Betty G, MD

## 2023-08-16 NOTE — Patient Instructions (Addendum)
 Sent script for Bentyl 10 mg prn for abdominal pain Will discuss with Dr. Karene Oto further testing options and our office will call you to set up.  _______________________________________________________  If your blood pressure at your visit was 140/90 or greater, please contact your primary care physician to follow up on this.  _______________________________________________________  If you are age 40 or older, your body mass index should be between 23-30. Your Body mass index is 25.73 kg/m. If this is out of the aforementioned range listed, please consider follow up with your Primary Care Provider.  If you are age 79 or younger, your body mass index should be between 19-25. Your Body mass index is 25.73 kg/m. If this is out of the aformentioned range listed, please consider follow up with your Primary Care Provider.   ________________________________________________________  The [facility] providers would like to encourage you to use MYCHART to communicate with providers for non-urgent requests or questions.  Due to long hold times on the telephone, sending your provider a message by [facility] may be a faster and more efficient way to get a response.  Please allow 48 business hours for a response.  Please remember that this is for non-urgent requests.  _______________________________________________________  Thank you for trusting me with your gastrointestinal care. Deanna May, RNP

## 2023-08-18 ENCOUNTER — Encounter: Payer: Self-pay | Admitting: Family Medicine

## 2023-08-24 ENCOUNTER — Encounter (INDEPENDENT_AMBULATORY_CARE_PROVIDER_SITE_OTHER): Payer: Self-pay | Admitting: Family Medicine

## 2023-08-24 DIAGNOSIS — B354 Tinea corporis: Secondary | ICD-10-CM | POA: Diagnosis not present

## 2023-08-24 MED ORDER — FLUCONAZOLE 150 MG PO TABS
150.0000 mg | ORAL_TABLET | ORAL | 0 refills | Status: AC
Start: 2023-08-24 — End: 2023-09-08

## 2023-08-24 NOTE — Telephone Encounter (Signed)
 Please see the MyChart message reply(ies) for my assessment and plan.  The patient gave consent for this Medical Advice Message and is aware that it may result in a bill to their insurance company as well as the possibility that this may result in a co-payment or deductible. They are an established patient, but are not seeking medical advice exclusively about a problem treated during an in person or video visit in the last 7 days. I did not recommend an in person or video visit within 7 days of my reply.  1. Tinea corporis (Primary) - fluconazole  (DIFLUCAN ) 150 MG tablet; Take 1 tablet (150 mg total) by mouth once a week for 3 doses.  Dispense: 3 tablet; Refill: 0  I spent a total of 10 minutes cumulative time within 7 days through [employer] Ami Thornsberry [place], MD

## 2023-09-06 ENCOUNTER — Other Ambulatory Visit: Payer: Self-pay | Admitting: Family Medicine

## 2023-09-06 DIAGNOSIS — F321 Major depressive disorder, single episode, moderate: Secondary | ICD-10-CM

## 2023-09-06 DIAGNOSIS — F411 Generalized anxiety disorder: Secondary | ICD-10-CM

## 2023-09-06 MED ORDER — FLUOXETINE HCL 20 MG PO CAPS
20.0000 mg | ORAL_CAPSULE | Freq: Every day | ORAL | 1 refills | Status: AC
Start: 2023-09-06 — End: ?

## 2023-09-06 NOTE — Telephone Encounter (Signed)
 Copied from CRM [id number]. Topic: Clinical - Medication Refill >> Sep 06, 2023  8:59 AM Emylou G wrote: Medication: FLUoxetine  (PROZAC ) 20 MG capsule  Has the patient contacted their pharmacy? Yes (Agent: If no, request that the patient contact the pharmacy for the refill. If patient does not wish to contact the pharmacy document the reason why and proceed with request.) (Agent: If yes, when and what did the pharmacy advise?) said to call us   This is the patient's preferred pharmacy:   [facility] [address] [place] - [address] [place] [address] Phone: [phone] Fax: [phone]   Is this the correct pharmacy for this prescription? Yes If no, delete pharmacy and type the correct one.   Has the prescription been filled recently? No  Is the patient out of the medication? Yes  Has the patient been seen for an appointment in the last year OR does the patient have an upcoming appointment? Yes  Can we respond through MyChart? Yes  Agent: Please be advised that Rx refills may take up to 3 business days. We ask that you follow-up with your pharmacy.

## 2023-10-06 NOTE — Progress Notes (Signed)
 Agree with the assessment and plan as outlined by [facility], FNP-C.  If contineud sxs, would not be unreasonable to evaluate with repeat EGD with Bravo placement (off PPI) to eval for degree of esophageal acid exposure and possibly continued atypical reflux sxs.   Ahmeer Tuman, DO, [facility]

## 2023-10-11 ENCOUNTER — Telehealth: Payer: Self-pay | Admitting: Gastroenterology

## 2023-10-11 NOTE — Telephone Encounter (Signed)
 Discussed with pt Dr. Emmanuel recommendations to proceed with EGD Bravo off PPI therapy.  All questions and concerns answered.  Patient would like to proceed with procedure.  Will have the office schedule.

## 2023-10-12 ENCOUNTER — Telehealth: Payer: Self-pay

## 2023-10-12 NOTE — Telephone Encounter (Signed)
-----   Message from Cathryne PARAS May sent at 10/11/2023  9:11 AM EDT ----- Karna- Can you schedule with cirigliano EGD with Bravo placement (off PPI) Ik:ozqu upper quadrant discomfort, dyspepsia, nausea, and waterbrash.    I already explained to her we would be calling to set up.  Deanna ----- Message ----- From: San Sandor GAILS, DO Sent: 10/06/2023   3:27 PM EDT To: Cathryne PARAS May, NP

## 2023-10-15 ENCOUNTER — Telehealth: Admitting: Family Medicine

## 2023-10-15 DIAGNOSIS — L259 Unspecified contact dermatitis, unspecified cause: Secondary | ICD-10-CM | POA: Diagnosis not present

## 2023-10-16 MED ORDER — BETAMETHASONE DIPROPIONATE 0.05 % EX CREA: TOPICAL_CREAM | Freq: Two times a day (BID) | CUTANEOUS | 0 refills | Status: AC

## 2023-10-16 NOTE — Progress Notes (Signed)
 E Visit for Rash  We are sorry that you are not feeling well. Here is how we plan to help!  I am prescribing betamethasone  cream.    HOME CARE:  Take cool showers and avoid direct sunlight. Apply cool compress or wet dressings. Take a bath in an oatmeal bath.  Sprinkle content of one Aveeno packet under running faucet with comfortably warm water.  Bathe for 15-20 minutes, 1-2 times daily.  Pat dry with a towel. Do not rub the rash. Use hydrocortisone cream. Take an antihistamine like Benadryl  for widespread rashes that itch.  The adult dose of Benadryl  is 25-50 mg by mouth 4 times daily. Caution:  This type of medication may cause sleepiness.  Do not drink alcohol, drive, or operate dangerous machinery while taking antihistamines.  Do not take these medications if you have prostate enlargement.  Read package instructions thoroughly on all medications that you take.  GET HELP RIGHT AWAY IF:  Symptoms don't go away after treatment. Severe itching that persists. If you rash spreads or swells. If you rash begins to smell. If it blisters and opens or develops a yellow-brown crust. You develop a fever. You have a sore throat. You become short of breath.  MAKE SURE YOU:  Understand these instructions. Will watch your condition. Will get help right away if you are not doing well or get worse.  Thank you for choosing an e-visit.  Your e-visit answers were reviewed by a board certified advanced clinical practitioner to complete your personal care plan. Depending upon the condition, your plan could have included both over the counter or prescription medications.  Please review your pharmacy choice. Make sure the pharmacy is open so you can pick up prescription now. If there is a problem, you may contact your provider through [employer] and have the prescription routed to another pharmacy.  Your safety is important to us . If you have drug allergies check your prescription carefully.    For the next 24 hours you can use MyChart to ask questions about today's visit, request a non-urgent call back, or ask for a work or school excuse. You will get an email in the next two days asking about your experience. I hope that your e-visit has been valuable and will speed your recovery.  have provided 5 minutes of non face to face time during this encounter for chart review and documentation.

## 2023-10-25 ENCOUNTER — Ambulatory Visit: Admitting: Family Medicine

## 2024-03-16 ENCOUNTER — Encounter: Payer: Self-pay | Admitting: Family Medicine

## 2024-03-16 ENCOUNTER — Ambulatory Visit: Admitting: Family Medicine

## 2024-03-16 VITALS — BP 110/68 | HR 86 | Temp 98.6°F | Resp 16 | Ht 67.0 in | Wt 161.8 lb

## 2024-03-16 DIAGNOSIS — H6993 Unspecified Eustachian tube disorder, bilateral: Secondary | ICD-10-CM | POA: Diagnosis not present

## 2024-03-16 DIAGNOSIS — J0100 Acute maxillary sinusitis, unspecified: Secondary | ICD-10-CM

## 2024-03-16 MED ORDER — AMOXICILLIN-POT CLAVULANATE 875-125 MG PO TABS: 1.0000 | ORAL_TABLET | Freq: Two times a day (BID) | ORAL | 0 refills | Status: AC

## 2024-03-16 MED ORDER — PREDNISONE 20 MG PO TABS: 40.0000 mg | ORAL_TABLET | Freq: Every day | ORAL | 0 refills | Status: AC

## 2024-03-16 MED ORDER — FLUTICASONE PROPIONATE 50 MCG/ACT NA SUSP: 1.0000 | Freq: Two times a day (BID) | NASAL | 3 refills | Status: AC

## 2024-03-16 NOTE — Patient Instructions (Addendum)
 A few things to remember from today's visit:  Acute non-recurrent maxillary sinusitis - Plan: predniSONE  (DELTASONE ) 20 MG tablet, amoxicillin -clavulanate (AUGMENTIN ) 875-125 MG tablet  Dysfunction of both eustachian tubes Although these symptoms could be related to recent flu infection. Prednisone  may help, take it with breakfast in the morning. Flonase  nasal spray at bedtime for 10 to 14 days. If symptoms are caused by a bacterial infection, Augmentin  will help. Pop your ears a few times throughout the day to help with ear fullness. Plain Mucinex may also help. If sinus pressure is not greatly improved in 2 weeks, we may need sinus imaging.  If you need refills for medications you take chronically, please call your pharmacy. Do not use My Chart to request refills or for acute issues that need immediate attention. If you send a my chart message, it may take a few days to be addressed, specially if I am not in the office.  Please be sure medication list is accurate. If a new problem present, please set up appointment sooner than planned today.

## 2024-03-16 NOTE — Progress Notes (Signed)
 "  ACUTE VISIT Chief Complaint  Patient presents with   Sinus Problem    Sinus pressure, ear fullness ( hard time hearing), low grade fever on and off , teeth pain x5 days ago - patient tested positive for the flu x 1 week.   Discussed the use of AI scribe software for clinical note transcription with the patient, who gave verbal consent to proceed.  History of Present Illness Virginia Robinson is a 39 year old female with a history with a PMHx significant for subclinical hypothyroidism, GERD, B12 deficiency here today c/o sinus problems as described above.  She has been experiencing sinus pressure and ear fullness for approximately five to six days. The pain is severe, particularly around the eyes and maxillary area, and is accompanied by general feeling of malaise. She rates her pain as a 5 out of 10.  Her symptoms began following a positive flu test about a week and a half ago.  She experienced a fever last night,  temp 99.48F, but has not had a fever today.  She has tried multiple over-the-counter medications including Tylenol , Tylenol  Cold and Flu, Theraflu, Sudafed, ibuprofen , and DayQuil, but none have provided relief.  She reports sensation of clogged ears, making it difficult to hear, and mentions needing to 'pop' her ears. No significant cough. She did not take antiviral therapy, she tested positive after her son had it.  Review of Systems  Constitutional:  Positive for activity change, appetite change and fatigue.  HENT:  Positive for sinus pain. Negative for ear discharge, facial swelling, mouth sores and sore throat.   Eyes:  Negative for discharge and redness.  Respiratory:  Negative for shortness of breath and wheezing.   Gastrointestinal:  Negative for abdominal pain, nausea and vomiting.  Genitourinary:  Negative for decreased urine volume, dysuria and hematuria.  Musculoskeletal:  Positive for neck pain. Negative for gait problem.  Skin:  Negative for rash.   Allergic/Immunologic: Positive for environmental allergies.  Neurological:  Positive for headaches. Negative for syncope, facial asymmetry, weakness and numbness.  Psychiatric/Behavioral:  Negative for confusion and hallucinations.   See other pertinent positives and negatives in HPI.  Medications Ordered Prior to Encounter[1]  Past Medical History:  Diagnosis Date   Anemia    reports since highschool with remote eval - takes B12 and MV with iron   Anxiety    Depression    Eating disorder    caloric restriction remotely - in college   Frequent headaches    History of stomach ulcers    no EGD, ? GERD rather then ulcer   Migraines    UTI (urinary tract infection)    Allergies[2]  Social History   Socioeconomic History   Marital status: Married    Spouse name: Not on file   Number of children: Not on file   Years of education: Not on file   Highest education level: Not on file  Occupational History   Not on file  Tobacco Use   Smoking status: Never   Smokeless tobacco: Never  Vaping Use   Vaping status: Never Used  Substance and Sexual Activity   Alcohol use: Yes    Alcohol/week: 0.0 standard drinks of alcohol    Comment: rarely   Drug use: No   Sexual activity: Yes  Other Topics Concern   Not on file  Social History Narrative   Work or School: Phd - professor fossil studies at [employer] Situation: lives  with husband      Spiritual Beliefs: catholic      Lifestyle: 11/2016 - no regular exercise, vegan diet for the most part   Social Drivers of Health   Tobacco Use: Low Risk (03/16/2024)   Patient History    Smoking Tobacco Use: Never    Smokeless Tobacco Use: Never    Passive Exposure: Not on file  Financial Resource Strain: Not on file  Food Insecurity: Not on file  Transportation Needs: Not on file  Physical Activity: Not on file  Stress: Not on file  Social Connections: Unknown (07/14/2021)   Received from [facility]   Social Network     Social Network: Not on file  Depression (PHQ2-9): Low Risk (03/16/2024)   Depression (PHQ2-9)    PHQ-2 Score: 0  Alcohol Screen: Not on file  Housing: Not on file  Utilities: Not on file  Health Literacy: Not on file    Vitals:   03/16/24 1106  BP: 110/68  Pulse: 86  Resp: 16  Temp: 98.6 F (37 C)  SpO2: 99%   Body mass index is 25.34 kg/m.  Physical Exam Vitals and nursing note reviewed.  Constitutional:      General: She is not in acute distress.    Appearance: She is well-developed. She is not ill-appearing.  HENT:     Head: Normocephalic and atraumatic.     Right Ear: Ear canal and external ear normal. A middle ear effusion is present. Tympanic membrane is bulging. Tympanic membrane is not erythematous.     Left Ear: Ear canal and external ear normal. A middle ear effusion is present. Tympanic membrane is bulging. Tympanic membrane is not erythematous.     Nose: No rhinorrhea.     Right Turbinates: Enlarged.     Left Turbinates: Enlarged.     Right Sinus: Maxillary sinus tenderness present. No frontal sinus tenderness.     Left Sinus: Maxillary sinus tenderness present. No frontal sinus tenderness.     Mouth/Throat:     Mouth: Mucous membranes are moist.     Pharynx: Postnasal drip present. No posterior oropharyngeal erythema.  Eyes:     Conjunctiva/sclera: Conjunctivae normal.  Cardiovascular:     Rate and Rhythm: Normal rate and regular rhythm.     Heart sounds: No murmur heard. Pulmonary:     Effort: Pulmonary effort is normal. No respiratory distress.     Breath sounds: Normal breath sounds. No stridor.  Musculoskeletal:     Cervical back: Neck supple. No muscular tenderness.  Lymphadenopathy:     Cervical: No cervical adenopathy.  Skin:    General: Skin is warm.     Findings: No erythema or rash.  Neurological:     General: No focal deficit present.     Mental Status: She is alert and oriented to person, place, and time.     Gait: Gait normal.   Psychiatric:        Mood and Affect: Affect normal. Mood is anxious.    ASSESSMENT AND PLAN:  Virginia Robinson was seen today for sinus problem.  Diagnoses and all orders for this visit:  Acute non-recurrent maxillary sinusitis Symptoms can be related to recent influenza and aggravated by allergies, in which case antibiotic treatment will not help. Prednisone  40 mg with breakfast for 3 days may help. Augmentin  875-125 mg twice daily started today. Nasal saline irrigations as needed throughout the day. Flonase  nasal spray at bedtime for 10 to 14 days then as needed. Plain  Mucinex may also help. If symptoms do not improve, we need to consider sinus CT. Follow-up as needed.  -     fluticasone  (FLONASE ) 50 MCG/ACT nasal spray; Place 1 spray into both nostrils 2 (two) times daily. -     predniSONE  (DELTASONE ) 20 MG tablet; Take 2 tablets (40 mg total) by mouth daily with breakfast for 3 days. -     amoxicillin -clavulanate (AUGMENTIN ) 875-125 MG tablet; Take 1 tablet by mouth 2 (two) times daily for 7 days.  Dysfunction of both eustachian tubes We discussed diagnosis, prognosis, treatment options. Auto inflation maneuvers a few times throughout the day may help. Screening hearing abnormal.  We discussed options at this time, she prefers to hold on ENT referral. She was clearly instructed about warning signs.  Hearing Screening   500Hz  1000Hz  2000Hz  4000Hz   Right ear Fail Fail Fail Fail  Left ear Fail Fail Fail Fail   Return if symptoms worsen or fail to improve.  Akil Hoos G. Loran Fleet, MD  [facility]. [place] [facility].     [1]  Current Outpatient Medications on File Prior to Visit  Medication Sig Dispense Refill   cetirizine (ZYRTEC) 10 MG tablet Take 10 mg by mouth daily.     FLUoxetine  (PROZAC ) 20 MG capsule Take 1 capsule (20 mg total) by mouth daily. 90 capsule 1   betamethasone  dipropionate 0.05 % cream Apply topically 2 (two) times daily. (Patient not taking:  Reported on 03/16/2024) 30 g 0   No current facility-administered medications on file prior to visit.  [2]  Allergies Allergen Reactions   Sulfa Antibiotics Rash   "
